# Patient Record
Sex: Female | Born: 1980 | Hispanic: No | Marital: Married | Smoking: Never smoker
Health system: Southern US, Community
[De-identification: ages and names within clinical notes are randomized; demographics above are authoritative.]

## PROBLEM LIST (undated history)

## (undated) DIAGNOSIS — D649 Anemia, unspecified: Secondary | ICD-10-CM

## (undated) DIAGNOSIS — Z789 Other specified health status: Secondary | ICD-10-CM

## (undated) DIAGNOSIS — O139 Gestational [pregnancy-induced] hypertension without significant proteinuria, unspecified trimester: Secondary | ICD-10-CM

## (undated) HISTORY — DX: Other specified health status: Z78.9

## (undated) HISTORY — DX: Gestational (pregnancy-induced) hypertension without significant proteinuria, unspecified trimester: O13.9

## (undated) HISTORY — DX: Anemia, unspecified: D64.9

---

## 2018-04-03 NOTE — L&D Delivery Note (Signed)
Date of delivery: 08/23/2018 Estimated Date of Delivery: 08/20/18 No LMP recorded. EGA: [redacted]w[redacted]d  Delivery Note At 3:13 PM a viable female was delivered via Vaginal, Spontaneous, Presentation: OA; ROA.  APGAR: 9, 9; weight 7 lb 5.8 oz (3340 g).   Placenta status: spontaneous, intact.  Cord:  with the following complications: none.  Cord pH: NA  Patient assessed for readiness to deliver as baby was having some variable decelerations. Mom pushed well to deliver a viable female infant.  The head followed by shoulders, which delivered without difficulty, and the rest of the body.  Body cord noted and reduced after delivery.  Baby to mom's chest.  Cord clamped and cut after 3 min delay.  Cord blood obtained.  Placenta delivered spontaneously, intact with active management, with a 3-vessel cord.  Perineum intact.  All counts correct.  Hemostasis obtained with IV pitocin and fundal massage.    Anesthesia: epidural  Episiotomy: None Lacerations: None Suture Repair: NA Est. Blood Loss (mL): 400  Mom to postpartum.  Baby to Couplet care / Skin to Skin.  Tresea Mall, CNM 08/23/2018, 5:35 PM

## 2018-04-29 LAB — HM PAP SMEAR: HM Pap smear: NEGATIVE

## 2018-04-29 LAB — OB RESULTS CONSOLE RUBELLA ANTIBODY, IGM: Rubella: IMMUNE

## 2018-04-29 LAB — OB RESULTS CONSOLE VARICELLA ZOSTER ANTIBODY, IGG: Varicella: IMMUNE

## 2018-04-29 LAB — SICKLE CELL SCREEN: Sickle Cell Screen: NORMAL

## 2018-05-27 LAB — HM HIV SCREENING LAB: HM HIV Screening: NEGATIVE

## 2018-06-26 ENCOUNTER — Telehealth: Payer: Self-pay | Admitting: Obstetrics and Gynecology

## 2018-06-26 NOTE — Telephone Encounter (Signed)
[facility] referring for [facility] consult. Called and left voicemail for patient to call back to be schedule

## 2018-07-01 ENCOUNTER — Encounter: Payer: Self-pay | Admitting: Obstetrics and Gynecology

## 2018-07-01 ENCOUNTER — Other Ambulatory Visit: Payer: Self-pay

## 2018-07-10 ENCOUNTER — Ambulatory Visit (INDEPENDENT_AMBULATORY_CARE_PROVIDER_SITE_OTHER): Payer: Self-pay | Admitting: Obstetrics and Gynecology

## 2018-07-10 ENCOUNTER — Other Ambulatory Visit: Payer: Self-pay

## 2018-07-10 ENCOUNTER — Encounter: Payer: Self-pay | Admitting: Obstetrics and Gynecology

## 2018-07-10 VITALS — Ht 65.0 in | Wt 202.0 lb

## 2018-07-10 DIAGNOSIS — O34219 Maternal care for unspecified type scar from previous cesarean delivery: Secondary | ICD-10-CM | POA: Insufficient documentation

## 2018-07-10 DIAGNOSIS — Z3A34 34 weeks gestation of pregnancy: Secondary | ICD-10-CM

## 2018-07-10 DIAGNOSIS — O0990 Supervision of high risk pregnancy, unspecified, unspecified trimester: Secondary | ICD-10-CM

## 2018-07-10 DIAGNOSIS — O0993 Supervision of high risk pregnancy, unspecified, third trimester: Secondary | ICD-10-CM

## 2018-07-10 DIAGNOSIS — O99013 Anemia complicating pregnancy, third trimester: Secondary | ICD-10-CM

## 2018-07-10 DIAGNOSIS — O99019 Anemia complicating pregnancy, unspecified trimester: Secondary | ICD-10-CM | POA: Insufficient documentation

## 2018-07-10 DIAGNOSIS — O09523 Supervision of elderly multigravida, third trimester: Secondary | ICD-10-CM

## 2018-07-10 NOTE — Progress Notes (Signed)
Virtual Visit via Telephone Note  I connected with Lindsey Whitney on 07/10/18 at  8:30 AM EDT by telephone and verified that I am speaking with the correct person using two identifiers.   I discussed the limitations, risks, security and privacy concerns of performing an evaluation and management service by telephone and the availability of in person appointments. I also discussed with the patient that there may be a patient responsible charge related to this service. The patient expressed understanding and agreed to proceed.  She was at home and I was in my office.  Chief Complaint: discuss trial of labor after cesarean delivery. She is seen in referral at the request of Dr. Lyndel Safe from the [facility] Department for this issue.  History of Present Illness: 38 y.o. G51P2002 female at [redacted]w[redacted]d who presents in referral from the [facility] health department for delivery planning.  The patient states that with her first pregnancy she underwent a primary cesarean section due to fetal malpresentation.  Her second pregnancy resulted in a successful vaginal birth after cesarean.  She greatly desires to have another attempted TOLAC.  Her pregnancy has been complicated by late entry into care at 24 weeks.  This may be due to her moving to the area later in the pregnancy.  She is also noted to have anemia for which she takes iron supplementation.  She is also considered advanced maternal age.  Today she notes good fetal movement.  She denies vaginal bleeding, leakage of fluid, and contractions.  Past Medical History:  Diagnosis Date  . No known health problems     Past Surgical History:  Procedure Laterality Date  . CESAREAN SECTION      Current Outpatient Medications on File Prior to Visit  Medication Sig Dispense Refill  . ferrous sulfate 325 (65 FE) MG tablet Take 325 mg by mouth daily with breakfast.    . Prenatal Vit-Fe Fumarate-FA (MULTIVITAMIN-PRENATAL) 27-0.8 MG TABS  tablet Take 1 tablet by mouth daily at 12 noon.     No current facility-administered medications on file prior to visit.    Allergies: No Known Allergies   Social History   Tobacco Use  . Smoking status: Never Smoker  . Smokeless tobacco: Never Used  Substance Use Topics  . Alcohol use: Never    Frequency: Never  . Drug use: Never     Family History: patient denies history of gynecologic cancer and denies history of birth defects.  Review of Systems  Constitutional: Negative.   HENT: Negative.   Eyes: Negative.   Respiratory: Negative.   Cardiovascular: Negative.   Gastrointestinal: Negative.   Genitourinary: Negative.   Musculoskeletal: Negative.   Skin: Negative.   Neurological: Negative.   Psychiatric/Behavioral: Negative.       Observations/Objective: No exam today, due to telephone eVisit due to [facility] virus restriction on elective visits and procedures.  Prior visits reviewed along with ultrasounds/labs as indicated.  Assessment and Plan: pregnancy Problems (from 07/10/18 to present)    Problem Noted Resolved   Supervision of high risk pregnancy, antepartum 07/10/2018 by Conard Novak, MD No   High risk pregnancy, multigravida of advanced maternal age in third trimester 07/10/2018 by Conard Novak, MD No   History of cesarean delivery affecting pregnancy 07/10/2018 by Conard Novak, MD No   Anemia in pregnancy 07/10/2018 by Conard Novak, MD No     38 y.o. [id number] at [redacted]w[redacted]d with Estimated Date of Delivery: 08/20/18 was seen today in  office to discuss trial of labor after cesarean section (TOLAC) versus elective repeat cesarean delivery (ERCD). The following risks were discussed with the patient.  Risk of uterine rupture at term is 0.78 percent with TOLAC and 0.22 percent with ERCD. 1 in 10 uterine ruptures will result in neonatal death or neurological injury. The benefits of a trial of labor after cesarean (TOLAC) resulting in a vaginal birth after  cesarean (VBAC) include the following: shorter length of hospital stay and postpartum recovery (in most cases); fewer complications, such as postpartum fever, wound or uterine infection, thromboembolism (blood clots in the leg or lung), need for blood transfusion and fewer neonatal breathing problems. The risks of an attempted VBAC or TOLAC include the following: Risk of failed trial of labor after cesarean (TOLAC) without a vaginal birth after cesarean (VBAC) resulting in repeat cesarean delivery (RCD) in about 20 to 40 percent of women who attempt VBAC.  Her individualized success rate using the MFMU VBAC risk calculator is 75%.   Risk of rupture of uterus resulting in an emergency cesarean delivery. The risk of uterine rupture may be related in part to the type of uterine incision made during the first cesarean delivery. A previous transverse uterine incision has the lowest risk of rupture (0.2 to 1.5 percent risk). Vertical or T-shaped uterine incisions have a higher risk of uterine rupture (4 to 9 percent risk)The risk of fetal death is very low with both VBAC and elective repeat cesarean delivery (ERCD), but the likelihood of fetal death is higher with VBAC than with ERCD. Maternal death is very rare with either type of delivery. The risks of an elective repeat cesarean delivery (ERCD) were reviewed with the patient including but not limited to: 05/998 risk of uterine rupture which could have serious consequences, bleeding which may require transfusion; infection which may require antibiotics; injury to bowel, bladder or other surrounding organs (bowel, bladder, ureters); injury to the fetus; need for additional procedures including hysterectomy in the event of a life-threatening hemorrhage; thromboembolic phenomenon; abnormal placentation; incisional problems; death and other postoperative or anesthesia complications.    In addition we discussed that our collective office practice is to allow patient's  who desire to attempt TOLAC to go into labor naturally.  There is some limited data that rupture rate may increase past [redacted] weeks gestation, but it is reasonable for women who are strongly committed to [facility] to continue pregnancy into the 41st week.  Medical indications necessetating early delivery may arise during the course of any pregnancy.  Given the contraindication on the use of prostaglandins for use in cervical ripening,  recommendation would be to proceed with repeat cesarean for delivery for patient's with unfavorable cervix (low Bishops score) who reach 41 weeks or who otherwise have a medical indication for early delivery.   These risks and benefits are summarized on the consent form, which was reviewed with the patient during the visit.  All her questions answered and she signed a consent indicating a preference for TOLAC/ERCD. A copy of the consent was given to the patient.  She would like to proceed with Trial Of Labor After Cesarean Delivery.  Given the patient's history, I think she would be an excellent candidate for a trial of labor.  She understands that she has the right to select a C-section instead of a vaginal delivery attempt.  All questions answered.  Follow Up Instructions:    I discussed the assessment and treatment plan with the patient. The patient was provided  an opportunity to ask questions and all were answered. The patient agreed with the plan and demonstrated an understanding of the instructions.   The patient was advised to call back or seek an in-person evaluation if the symptoms worsen or if the condition fails to improve as anticipated.  I provided 30 minutes of non-face-to-face time during this encounter.  Thomasene MohairStephen , MD, Merlinda FrederickFACOG [facility], [facility] 07/10/2018 12:05 PM    CC: Department, [facility] [address] [place], [place] [id number]

## 2018-07-24 ENCOUNTER — Other Ambulatory Visit: Payer: Self-pay | Admitting: Advanced Practice Midwife

## 2018-07-24 DIAGNOSIS — Z3483 Encounter for supervision of other normal pregnancy, third trimester: Secondary | ICD-10-CM

## 2018-07-26 ENCOUNTER — Other Ambulatory Visit: Payer: Self-pay

## 2018-07-26 ENCOUNTER — Ambulatory Visit
Admission: RE | Admit: 2018-07-26 | Discharge: 2018-07-26 | Disposition: A | Discharge status: Home / Self Care | Payer: Self-pay | Source: Ambulatory Visit | Attending: Advanced Practice Midwife | Admitting: Advanced Practice Midwife

## 2018-07-26 DIAGNOSIS — Z3483 Encounter for supervision of other normal pregnancy, third trimester: Secondary | ICD-10-CM | POA: Insufficient documentation

## 2018-07-31 ENCOUNTER — Other Ambulatory Visit: Payer: Self-pay

## 2018-07-31 ENCOUNTER — Observation Stay
Admission: RE | Admit: 2018-07-31 | Discharge: 2018-07-31 | Disposition: A | Discharge status: Home / Self Care | Payer: Self-pay | Attending: Obstetrics & Gynecology | Admitting: Obstetrics & Gynecology

## 2018-07-31 DIAGNOSIS — O0990 Supervision of high risk pregnancy, unspecified, unspecified trimester: Secondary | ICD-10-CM

## 2018-07-31 DIAGNOSIS — O99013 Anemia complicating pregnancy, third trimester: Secondary | ICD-10-CM

## 2018-07-31 DIAGNOSIS — Z3A37 37 weeks gestation of pregnancy: Secondary | ICD-10-CM

## 2018-07-31 DIAGNOSIS — O321XX0 Maternal care for breech presentation, not applicable or unspecified: Secondary | ICD-10-CM

## 2018-07-31 DIAGNOSIS — O34219 Maternal care for unspecified type scar from previous cesarean delivery: Secondary | ICD-10-CM

## 2018-07-31 DIAGNOSIS — O09523 Supervision of elderly multigravida, third trimester: Secondary | ICD-10-CM

## 2018-07-31 NOTE — Discharge Summary (Signed)
Physician Final Progress Note  Patient ID: Lindsey Whitney MRN: [id number] DOB/AGE: 1980-04-09 38 y.o.  Admit date: 07/31/2018 Admitting provider: Nadara Mustard, MD Discharge date: 07/31/2018   Admission Diagnoses: breech on previous ultrasound  Discharge Diagnoses:  Active Problems:   Breech presentation   History of successful vaginal birth after cesarean, currently pregnant IUP at 37 weeks Reactive NST  History of Present Illness: The patient is a 38 y.o. female G3P2002 at [redacted]w[redacted]d who presents from the health department for evaluation of fetal presentation. Fetus was breech on ultrasound 5 days ago. Provider at health department today was concerned if baby was still breech she would need to be scheduled for external version soon and was requesting ultrasound evaluation. Dr Tiburcio Pea later informed me that ECV is not recommended with a history of cesarean section even though she had a successful VBAC. The patient admits good fetal movement. She admits occasional braxton hicks contractions. She denies leakage of fluid or vaginal bleeding. She has not felt any big fetal movements since her ultrasound 5 days ago.   Past Medical History:  Diagnosis Date  . No known health problems     Past Surgical History:  Procedure Laterality Date  . CESAREAN SECTION      No current facility-administered medications on file prior to encounter.    Current Outpatient Medications on File Prior to Encounter  Medication Sig Dispense Refill  . ferrous sulfate 325 (65 FE) MG tablet Take 325 mg by mouth daily with breakfast.    . Prenatal Vit-Fe Fumarate-FA (MULTIVITAMIN-PRENATAL) 27-0.8 MG TABS tablet Take 1 tablet by mouth daily at 12 noon.      No Known Allergies  Social History   Socioeconomic History  . Marital status: Married    Spouse name: Not on file  . Number of children: Not on file  . Years of education: Not on file  . Highest education level: Not on file  Occupational History  .  Not on file  Social Needs  . Financial resource strain: Not on file  . Food insecurity:    Worry: Not on file    Inability: Not on file  . Transportation needs:    Medical: Not on file    Non-medical: Not on file  Tobacco Use  . Smoking status: Never Smoker  . Smokeless tobacco: Never Used  Substance and Sexual Activity  . Alcohol use: Never    Frequency: Never  . Drug use: Never  . Sexual activity: Yes    Birth control/protection: None  Lifestyle  . Physical activity:    Days per week: Not on file    Minutes per session: Not on file  . Stress: Not on file  Relationships  . Social connections:    Talks on phone: Not on file    Gets together: Not on file    Attends religious service: Not on file    Active member of club or organization: Not on file    Attends meetings of clubs or organizations: Not on file    Relationship status: Not on file  . Intimate partner violence:    Fear of current or ex partner: Not on file    Emotionally abused: Not on file    Physically abused: Not on file    Forced sexual activity: Not on file  Other Topics Concern  . Not on file  Social History Narrative  . Not on file    History reviewed. No pertinent family history.   Review of  Systems  Constitutional: Negative.   HENT: Negative.   Eyes: Negative.   Respiratory: Negative.   Cardiovascular: Negative.   Gastrointestinal: Negative.   Genitourinary: Negative.   Musculoskeletal: Negative.   Skin: Negative.   Neurological: Negative.   Endo/Heme/Allergies: Negative.   Psychiatric/Behavioral: Negative.      Physical Exam: BP 131/69 (BP Location: Left Arm)   Pulse 93   Temp 98.8 F (37.1 C) (Oral)   Resp 18   Ht 5\' 5"  (1.651 m)   Wt 91.6 kg   SpO2 100%   BMI 33.61 kg/m   Constitutional: Well nourished, well developed female in no acute distress.  HEENT: normal Skin: Warm and dry.  Cardiovascular: Regular rate and rhythm.   Extremity: no edema  Respiratory: Clear to  auscultation bilateral. Normal respiratory effort Abdomen: FHT present Back: no CVAT Neuro: DTRs 2+, Cranial nerves grossly intact Psych: Alert and Oriented x3. No memory deficits. Normal mood and affect.  MS: normal gait, normal bilateral lower extremity ROM/strength/stability.  Pelvic exam: deferred Toco: occasional contraction, mild to palpation Fetal well being: 130 bpm baseline, moderate variability, +accelerations, variable noted  Consults: None  Significant Findings/ Diagnostic Studies: bedside ultrasound Breech with vertex maternal upper left quadrant  Procedures: NST  Hospital Course: The patient was admitted to Labor and Delivery Triage for observation.   Discharge Condition: good  Disposition: Discharge disposition: 01-Home or Self Care       Diet: Regular diet  Discharge Activity: Activity as tolerated  Discharge Instructions    Discharge activity:  No Restrictions   Complete by:  As directed    Discharge diet:  No restrictions   Complete by:  As directed    Fetal Kick Count:  Lie on our left side for one hour after a meal, and count the number of times your baby kicks.  If it is less than 5 times, get up, move around and drink some juice.  Repeat the test 30 minutes later.  If it is still less than 5 kicks in an hour, notify your doctor.   Complete by:  As directed    LABOR:  When conractions begin, you should start to time them from the beginning of one contraction to the beginning  of the next.  When contractions are 5 - 10 minutes apart or less and have been regular for at least an hour, you should call your health care provider.   Complete by:  As directed    No sexual activity restrictions   Complete by:  As directed    Notify physician for bleeding from the vagina   Complete by:  As directed    Notify physician for blurring of vision or spots before the eyes   Complete by:  As directed    Notify physician for chills or fever   Complete by:  As  directed    Notify physician for fainting spells, "black outs" or loss of consciousness   Complete by:  As directed    Notify physician for increase in vaginal discharge   Complete by:  As directed    Notify physician for leaking of fluid   Complete by:  As directed    Notify physician for pain or burning when urinating   Complete by:  As directed    Notify physician for pelvic pressure (sudden increase)   Complete by:  As directed    Notify physician for severe or continued nausea or vomiting   Complete by:  As directed  Notify physician for sudden gushing of fluid from the vagina (with or without continued leaking)   Complete by:  As directed    Notify physician for sudden, constant, or occasional abdominal pain   Complete by:  As directed    Notify physician if baby moving less than usual   Complete by:  As directed      Allergies as of 07/31/2018   No Known Allergies     Medication List    TAKE these medications   ferrous sulfate 325 (65 FE) MG tablet Take 325 mg by mouth daily with breakfast.   multivitamin-prenatal 27-0.8 MG Tabs tablet Take 1 tablet by mouth daily at 12 noon.      Recommended patient look up spinning babies positions for optimal fetal positioning  Follow-up Information    [facility]. Schedule an appointment as soon as possible for a visit in 1 week(s).   Specialty:  Obstetrics and Gynecology Why:  prenatal care visit with Dr Lalla Brothers information: [address] [place] [address] [phone]          Total time spent taking care of this patient: 15 minutes  Signed: Tresea Mall, CNM  07/31/2018, 2:53 PM

## 2018-07-31 NOTE — Discharge Summary (Signed)
RN reviewed discharge instructions with patient. Gave patient opportunity for questions. All questions answered at this time. Pt verbalized understanding. Pt discharged home. 

## 2018-07-31 NOTE — OB Triage Note (Signed)
Fetal monitors applied. Pt denies pain, bleeding , or LOF. Reports positive fetal movement. Vitals WNL. Will continue to monitor.

## 2018-08-01 ENCOUNTER — Telehealth: Payer: Self-pay | Admitting: Obstetrics & Gynecology

## 2018-08-01 NOTE — Telephone Encounter (Signed)
-----   Message from Nadara Mustard, MD sent at 08/01/2018  1:54 PM EDT ----- Regarding: Surgery Surgery Booking Request Patient Full Name:  Lindsey Whitney  MRN: [id number]  DOB: [birth year]  Surgeon: Letitia Libra, MD  Requested Surgery Date and Time: 08/15/18 Primary Diagnosis AND Code: Breech, Term pregnancy, Prior cesarean Secondary Diagnosis and Code:  Surgical Procedure: Cesarean Section L&D Notification: Yes Admission Status: surgery admit Length of Surgery: 1 hr Special Case Needs: no H&P: yes May 8 (date) Phone Interview???: yes Interpreter: Language:  Medical Clearance: no Special Scheduling Instructions: no

## 2018-08-01 NOTE — Telephone Encounter (Signed)
Patient is aware of H&P at [facility] on Friday, 08/09/18 @ 2:10pm w/ Dr. Tiburcio Pea, Pre-admit Testing to be scheduled for the following week, and OR on 08/15/18. Directions were given. Patient is aware of check-in procedure.

## 2018-08-04 ENCOUNTER — Encounter: Payer: Self-pay | Admitting: Anesthesiology

## 2018-08-04 ENCOUNTER — Encounter: Payer: Self-pay | Admitting: *Deleted

## 2018-08-04 ENCOUNTER — Observation Stay
Admission: RE | Admit: 2018-08-04 | Discharge: 2018-08-04 | Disposition: A | Discharge status: Home / Self Care | Payer: Self-pay | Attending: Obstetrics and Gynecology | Admitting: Obstetrics and Gynecology

## 2018-08-04 DIAGNOSIS — O0990 Supervision of high risk pregnancy, unspecified, unspecified trimester: Secondary | ICD-10-CM

## 2018-08-04 DIAGNOSIS — O34219 Maternal care for unspecified type scar from previous cesarean delivery: Secondary | ICD-10-CM

## 2018-08-04 DIAGNOSIS — O321XX0 Maternal care for breech presentation, not applicable or unspecified: Principal | ICD-10-CM | POA: Diagnosis present

## 2018-08-04 DIAGNOSIS — O99013 Anemia complicating pregnancy, third trimester: Secondary | ICD-10-CM | POA: Insufficient documentation

## 2018-08-04 DIAGNOSIS — O09523 Supervision of elderly multigravida, third trimester: Secondary | ICD-10-CM | POA: Insufficient documentation

## 2018-08-04 DIAGNOSIS — Z3A37 37 weeks gestation of pregnancy: Secondary | ICD-10-CM | POA: Insufficient documentation

## 2018-08-04 HISTORY — DX: Other specified health status: Z78.9

## 2018-08-04 LAB — CBC
HCT: 39 % (ref 36.0–46.0)
Hemoglobin: 12.8 g/dL (ref 12.0–15.0)
MCH: 30.4 pg (ref 26.0–34.0)
MCHC: 32.8 g/dL (ref 30.0–36.0)
MCV: 92.6 fL (ref 80.0–100.0)
Platelets: 229 10*3/uL (ref 150–400)
RBC: 4.21 MIL/uL (ref 3.87–5.11)
RDW: 15.2 % (ref 11.5–15.5)
WBC: 10 10*3/uL (ref 4.0–10.5)
nRBC: 0 % (ref 0.0–0.2)

## 2018-08-04 LAB — TYPE AND SCREEN
ABO/RH(D): O POS
Antibody Screen: NEGATIVE

## 2018-08-04 MED ORDER — MINERAL OIL LIGHT 100 % EX OIL
TOPICAL_OIL | Freq: Once | CUTANEOUS | Status: DC
  Filled 2018-08-04: qty 25

## 2018-08-04 MED ORDER — FENTANYL CITRATE (PF) 100 MCG/2ML IJ SOLN
50.0000 ug | INTRAMUSCULAR | Status: DC
  Administered 2018-08-04: 20:00:00 50 ug via INTRAVENOUS
  Filled 2018-08-04: qty 2

## 2018-08-04 MED ORDER — LACTATED RINGERS IV SOLN
INTRAVENOUS | Status: DC
  Administered 2018-08-04: 19:00:00 via INTRAVENOUS

## 2018-08-04 MED ORDER — TERBUTALINE SULFATE 1 MG/ML IJ SOLN
0.2500 mg | INTRAMUSCULAR | Status: DC
  Administered 2018-08-04: 0.25 mg via SUBCUTANEOUS
  Filled 2018-08-04: qty 1

## 2018-08-04 MED ORDER — LIDOCAINE HCL (PF) 1 % IJ SOLN: 30.0000 mL | INTRAMUSCULAR | Status: DC | PRN

## 2018-08-04 NOTE — H&P (Signed)
OB History & Physical   History of Present Illness:  Chief Complaint: Malpresentation of fetus  HPI:  Lindsey Whitney is a 38 y.o. G50P2002 female at [redacted]w[redacted]d dated by an 18 week ultrasound (see Media tab under AMB referral on 07/18/2018).  Her pregnancy has been complicated by history of cesarean delivery with subsequent successfull VBAC, late entry into care in the [place] at 24 weeks (just moved here), anemia, AMA. .    She reports mild contractions.   She denies leakage of fluid.   She denies vaginal bleeding.   She reports fetal movement.    Maternal Medical History:   Past Medical History:  Diagnosis Date  . Medical history non-contributory   . No known health problems     Past Surgical History:  Procedure Laterality Date  . CESAREAN SECTION      No Known Allergies  Prior to Admission medications   Medication Sig Start Date End Date Taking? Authorizing Provider  ferrous sulfate 325 (65 FE) MG tablet Take 325 mg by mouth daily with breakfast.   Yes [provider]  Prenatal Vit-Fe Fumarate-FA (MULTIVITAMIN-PRENATAL) 27-0.8 MG TABS tablet Take 1 tablet by mouth daily at 12 noon.   Yes [provider]    OB History  Gravida Para Term Preterm AB Living  3 2 2     2   SAB TAB Ectopic Multiple Live Births          2    # Outcome Date GA Lbr Len/2nd Weight Sex Delivery Anes PTL Lv  3 Current           2 Term 09/20/11    F VBAC   LIV  1 Term 04/22/07    F CS-LTranv   LIV    Prenatal care site: [facility]  Social History: She  reports that she has never smoked. She has never used smokeless tobacco. She reports that she does not drink alcohol or use drugs.  Family History: Patient denies history of gynecologic cancer and denies a history of birth defects.   Review of Systems:  Review of Systems  Constitutional: Negative.   HENT: Negative.   Eyes: Negative.   Respiratory: Negative.   Cardiovascular: Negative.   Gastrointestinal: Negative.   Genitourinary:  Negative.   Musculoskeletal: Negative.   Skin: Negative.   Neurological: Negative.   Psychiatric/Behavioral: Negative.      Physical Exam:  Vital Signs: BP 123/81 (BP Location: Right Arm)   Pulse 90   Temp 98.5 F (36.9 C) (Oral)  Physical Exam Constitutional:      General: She is not in acute distress.    Appearance: Normal appearance.  HENT:     Head: Normocephalic and atraumatic.  Eyes:     General: No scleral icterus.    Conjunctiva/sclera: Conjunctivae normal.  Abdominal:     Comments: Gravid, NT  Neurological:     General: No focal deficit present.     Mental Status: She is alert and oriented to person, place, and time.     Cranial Nerves: No cranial nerve deficit.  Psychiatric:        Mood and Affect: Mood normal.        Behavior: Behavior normal.        Judgment: Judgment normal.       Pertinent Results:   Baseline FHR: 130 beats/min   Variability: moderate   Accelerations: present   Decelerations: absent Contractions: present frequency: infrequent Overall assessment: cat 1  Bedside Ultrasound:  Number  of Fetus: 1  Presentation: (prior to procedure) breech/transverse. Head maternal left upper quadrant. Breech just to right of cervical os  Fluid: subjectively normal.  Placental Location: anterior/left  Assessment:  Lindsey Whitney is a 38 y.o. 503P2002 female at 5761w5d with malpresentation of fetus for external cephalic version.   Plan:  1. Admit to Labor & Delivery for procedure 2. CBC, T&S, NPO, IVF 3. GBS positive.   4. Fetwal well-being: reassuring 5. Malpresentation of fetus: discussed the risks and benefits of procedure. The risks include; failure of procedure, success of procedure with subsequent return to a non-cephalic presentation, discomfort, provocation of labor, rupture of membranes, placental abruption, fetal intolerance of procedure.  Benefits include: ability to attempt a trial of labor after cesarean. She has previously been counseled  regarding TOLAC. See previous notefrom 07/10/2018.  She agrees to proceed. Consents signed.    Thomasene MohairStephen Knut Rondinelli, MD 08/04/2018 8:43 PM

## 2018-08-04 NOTE — OB Triage Note (Signed)
Patient to LDR 2 for elective external cephalic version.

## 2018-08-04 NOTE — Discharge Summary (Signed)
Physician Final Progress Note  Patient ID: Lindsey Whitney MRN: [id number] DOB/AGE: 1980-06-16 38 y.o.  Admit date: 08/04/2018 Admitting provider: Conard NovakStephen D Jackson, MD Discharge date: 08/05/2018   Admission Diagnoses: presents for external cephalic version  Discharge Diagnoses:  Active Problems:   Supervision of high risk pregnancy, antepartum   Breech presentation IUP at 37 weeks Reactive NST Successful external version  Prenatal Care at [facility]  History of Present Illness: The patient is a 38 y.o. female G3P2002 at 3424w5d who presents for external cephalic version with Dr Jean RosenthalJackson. The patient has a history of a cesarean section followed by a successful VBAC in [place] in 2009 and 2013. Her current pregnancy is complicated by anemia and breech presentation. Following successful version this evening- see procedure note- she was monitored for 2 hours. Fetal heart tones were reassuring throughout.  Contraction frequency was every 3-10 minutes. The patient did not feel that she was in labor. She admits good fetal movement. She denies leakage of fluid or vaginal bleeding. Cervical exam was reassuring that patient was not in labor. She was discharged home with follow up instructions and precautions.   Past Medical History:  Diagnosis Date  . Medical history non-contributory   . No known health problems     Past Surgical History:  Procedure Laterality Date  . CESAREAN SECTION      No current facility-administered medications on file prior to encounter.    Current Outpatient Medications on File Prior to Encounter  Medication Sig Dispense Refill  . ferrous sulfate 325 (65 FE) MG tablet Take 325 mg by mouth daily with breakfast.    . Prenatal Vit-Fe Fumarate-FA (MULTIVITAMIN-PRENATAL) 27-0.8 MG TABS tablet Take 1 tablet by mouth daily at 12 noon.      No Known Allergies  Social History   Socioeconomic History  . Marital status: Married    Spouse name: Not on file  . Number of  children: Not on file  . Years of education: Not on file  . Highest education level: Not on file  Occupational History  . Not on file  Social Needs  . Financial resource strain: Not on file  . Food insecurity:    Worry: Not on file    Inability: Not on file  . Transportation needs:    Medical: Not on file    Non-medical: Not on file  Tobacco Use  . Smoking status: Never Smoker  . Smokeless tobacco: Never Used  Substance and Sexual Activity  . Alcohol use: Never    Frequency: Never  . Drug use: Never  . Sexual activity: Yes    Birth control/protection: None  Lifestyle  . Physical activity:    Days per week: Not on file    Minutes per session: Not on file  . Stress: Not on file  Relationships  . Social connections:    Talks on phone: Not on file    Gets together: Not on file    Attends religious service: Not on file    Active member of club or organization: Not on file    Attends meetings of clubs or organizations: Not on file    Relationship status: Not on file  . Intimate partner violence:    Fear of current or ex partner: Not on file    Emotionally abused: Not on file    Physically abused: Not on file    Forced sexual activity: Not on file  Other Topics Concern  . Not on file  Social History Narrative  .  Not on file    History reviewed. No pertinent family history.   Review of Systems  Constitutional: Negative.   HENT: Negative.   Eyes: Negative.   Respiratory: Negative.   Cardiovascular: Negative.   Gastrointestinal:       Uterine contractions  Genitourinary: Negative.   Musculoskeletal: Negative.   Skin: Negative.   Neurological: Negative.   Endo/Heme/Allergies: Negative.   Psychiatric/Behavioral: Negative.      Physical Exam: BP 123/81 (BP Location: Right Arm)   Pulse 90   Temp 98.5 F (36.9 C) (Oral)   Constitutional: Well nourished, well developed female in no acute distress.  HEENT: normal Skin: Warm and dry.  Cardiovascular: Regular  rate and rhythm.   Extremity: no edema  Respiratory: Clear to auscultation bilateral. Normal respiratory effort Abdomen: FHT present Back: no CVAT Neuro: DTRs 2+, Cranial nerves grossly intact Psych: Alert and Oriented x3. No memory deficits. Normal mood and affect.  MS: normal gait, normal bilateral lower extremity ROM/strength/stability.  Pelvic exam: (female chaperone present) is not limited by body habitus EGBUS: within normal limits Vagina: within normal limits and with normal mucosa Cervix: posterior FT/thick/ballotable   Consults: None  Significant Findings/ Diagnostic Studies: labs:   Results for Lindsey Whitney, Lindsey Whitney (MRN [id number]) as of 08/05/2018 00:47  Ref. Range 08/04/2018 18:53 08/04/2018 19:17  WBC Latest Ref Range: 4.0 - 10.5 K/uL 10.0   RBC Latest Ref Range: 3.87 - 5.11 MIL/uL 4.21   Hemoglobin Latest Ref Range: 12.0 - 15.0 g/dL 88.5   HCT Latest Ref Range: 36.0 - 46.0 % 39.0   MCV Latest Ref Range: 80.0 - 100.0 fL 92.6   MCH Latest Ref Range: 26.0 - 34.0 pg 30.4   MCHC Latest Ref Range: 30.0 - 36.0 g/dL 02.7   RDW Latest Ref Range: 11.5 - 15.5 % 15.2   Platelets Latest Ref Range: 150 - 400 K/uL 229   nRBC Latest Ref Range: 0.0 - 0.2 % 0.0   Sample Expiration Unknown 08/07/2018... 08/07/2018...  Antibody Screen Unknown PENDING NEG  ABO/RH(D) Unknown PENDING O POS    Procedures: NST/external cephalic version  Hospital Course: The patient was admitted to Labor and Delivery for procedure.   Discharge Condition: good  Disposition: Discharge disposition: 01-Home or Self Care       Diet: Regular diet  Discharge Activity: Activity as tolerated  Discharge Instructions    Discharge activity:  No Restrictions   Complete by:  As directed    Discharge diet:  No restrictions   Complete by:  As directed    Fetal Kick Count:  Lie on our left side for one hour after a meal, and count the number of times your baby kicks.  If it is less than 5 times, get up, move  around and drink some juice.  Repeat the test 30 minutes later.  If it is still less than 5 kicks in an hour, notify your doctor.   Complete by:  As directed    LABOR:  When conractions begin, you should start to time them from the beginning of one contraction to the beginning  of the next.  When contractions are 5 - 10 minutes apart or less and have been regular for at least an hour, you should call your health care provider.   Complete by:  As directed    No sexual activity restrictions   Complete by:  As directed    Notify physician for bleeding from the vagina   Complete by:  As directed  Notify physician for blurring of vision or spots before the eyes   Complete by:  As directed    Notify physician for chills or fever   Complete by:  As directed    Notify physician for fainting spells, "black outs" or loss of consciousness   Complete by:  As directed    Notify physician for increase in vaginal discharge   Complete by:  As directed    Notify physician for leaking of fluid   Complete by:  As directed    Notify physician for pain or burning when urinating   Complete by:  As directed    Notify physician for pelvic pressure (sudden increase)   Complete by:  As directed    Notify physician for severe or continued nausea or vomiting   Complete by:  As directed    Notify physician for sudden gushing of fluid from the vagina (with or without continued leaking)   Complete by:  As directed    Notify physician for sudden, constant, or occasional abdominal pain   Complete by:  As directed    Notify physician if baby moving less than usual   Complete by:  As directed      Allergies as of 08/04/2018   No Known Allergies     Medication List    TAKE these medications   ferrous sulfate 325 (65 FE) MG tablet Take 325 mg by mouth daily with breakfast.   multivitamin-prenatal 27-0.8 MG Tabs tablet Take 1 tablet by mouth daily at 12 noon.      Follow-up Information    Department,  [facility] Go to.   Why:  regular scheduled prenatal appointment Contact information: [address] [place] [address] [phone]           Total time spent taking care of this patient: 45 minutes  Signed: Tresea Mall, CNM  08/05/2018, 12:31 AM

## 2018-08-04 NOTE — Procedures (Signed)
The risks, benefits, and alternatives of the procedure were reviewed with the patient.  Greater than 1 hour of fetal monitoring with category 1 tracing obtained prior to procedure.  The patient already had an IV. She was given fentanyl 50 mcg IV x 1. She was given terbutaline [place] x 1.  Using intermittent ultrasound for fetal positioning and heart rate monitoring, the fetal breech was attempted to be elevated from the pelvis.  Elevation of the fetal breech was obtained with an attempted forward roll of the fetus x 1.  The fetus was successfully moved to cephalic presentation with spine on maternal left.  The procedure were terminated at this point.  The patient tolerated the procedure well.  No evidence of fetal distress, no vaginal bleeding, no rupture of membranes.    Patient and fetus will be monitored for about 1-2 hours to ensure no labor and reassuring fetal status.  Fetal heart rate monitored intermittently by ultrasound and documented in EPIC at the time of monitoring.  Thomasene Mohair, MD 08/04/2018 8:58 PM

## 2018-08-09 ENCOUNTER — Encounter: Payer: Self-pay | Admitting: Obstetrics & Gynecology

## 2018-08-09 ENCOUNTER — Other Ambulatory Visit: Payer: Self-pay

## 2018-08-09 ENCOUNTER — Ambulatory Visit (INDEPENDENT_AMBULATORY_CARE_PROVIDER_SITE_OTHER): Payer: Self-pay | Admitting: Obstetrics & Gynecology

## 2018-08-09 VITALS — BP 140/82 | HR 107 | Ht 65.0 in | Wt 215.0 lb

## 2018-08-09 DIAGNOSIS — O321XX0 Maternal care for breech presentation, not applicable or unspecified: Secondary | ICD-10-CM

## 2018-08-09 DIAGNOSIS — Z3A38 38 weeks gestation of pregnancy: Secondary | ICD-10-CM

## 2018-08-09 NOTE — Progress Notes (Signed)
  HPI: Pt presents at 38 3/[redacted] weeks EGA for discussion of breech presentation and delivery planning.  Was seen on Sunday and had ECV at hospital.  Was seen Tuesday at [facility] and was noted to be Vtx.  First pregnancy was breech resulting in CS.  Second pregnancy was a VBAC.  No pain or bleeding or ROM.  Good FM.  PMHx: She  has a past medical history of Medical history non-contributory and No known health problems. Also,  has a past surgical history that includes Cesarean section., family history is not on file.,  reports that she has never smoked. She has never used smokeless tobacco. She reports that she does not drink alcohol or use drugs.  She has a current medication list which includes the following prescription(s): ferrous sulfate and multivitamin-prenatal. Also, has No Known Allergies.  Review of Systems  All other systems reviewed and are negative.  Objective: BP 140/82   Pulse (!) 107   Ht 5\' 5"  (1.651 m)   Wt 215 lb (97.5 kg)   BMI 35.78 kg/m   Physical examination Constitutional NAD, Conversant  Skin No rashes, lesions or ulceration.   Extremities: Moves all appropriately.  Normal ROM for age. No lymphadenopathy.  Neuro: Grossly intact  Psych: Oriented to PPT.  Normal mood. Normal affect.       Abd: FHT 140s.  Vtx by leopolds.  Assessment:  Breech presentation, single or unspecified fetus     Now Vertex following recent ECV    Cont exp mgt, prefers VBAC if labors.  Annamarie Major, MD, [facility], [facility] 08/09/2018  2:46 PM

## 2018-08-12 ENCOUNTER — Inpatient Hospital Stay: Admission: RE | Admit: 2018-08-12 | Payer: Self-pay | Source: Ambulatory Visit

## 2018-08-14 ENCOUNTER — Inpatient Hospital Stay: Admission: RE | Admit: 2018-08-14 | Payer: Self-pay | Source: Ambulatory Visit

## 2018-08-15 ENCOUNTER — Encounter: Admission: RE | Payer: Self-pay | Source: Home / Self Care

## 2018-08-15 ENCOUNTER — Inpatient Hospital Stay: Admission: RE | Admit: 2018-08-15 | Payer: Self-pay | Source: Home / Self Care | Admitting: Obstetrics & Gynecology

## 2018-08-15 SURGERY — Surgical Case
Anesthesia: Choice

## 2018-08-22 ENCOUNTER — Other Ambulatory Visit: Payer: Self-pay | Admitting: Obstetrics & Gynecology

## 2018-08-23 ENCOUNTER — Inpatient Hospital Stay: Payer: Medicaid Other | Admitting: Anesthesiology

## 2018-08-23 ENCOUNTER — Inpatient Hospital Stay
Admission: EM | Admit: 2018-08-23 | Discharge: 2018-08-25 | DRG: 807 | Disposition: A | Discharge status: Home / Self Care | Payer: Medicaid Other | Attending: Advanced Practice Midwife | Admitting: Advanced Practice Midwife

## 2018-08-23 ENCOUNTER — Other Ambulatory Visit: Payer: Self-pay

## 2018-08-23 ENCOUNTER — Encounter: Payer: Self-pay | Admitting: *Deleted

## 2018-08-23 DIAGNOSIS — Z3A40 40 weeks gestation of pregnancy: Secondary | ICD-10-CM

## 2018-08-23 DIAGNOSIS — O9902 Anemia complicating childbirth: Secondary | ICD-10-CM

## 2018-08-23 DIAGNOSIS — O34219 Maternal care for unspecified type scar from previous cesarean delivery: Secondary | ICD-10-CM | POA: Diagnosis present

## 2018-08-23 DIAGNOSIS — O0990 Supervision of high risk pregnancy, unspecified, unspecified trimester: Secondary | ICD-10-CM

## 2018-08-23 DIAGNOSIS — Z1159 Encounter for screening for other viral diseases: Secondary | ICD-10-CM | POA: Diagnosis not present

## 2018-08-23 DIAGNOSIS — O26893 Other specified pregnancy related conditions, third trimester: Secondary | ICD-10-CM | POA: Diagnosis present

## 2018-08-23 DIAGNOSIS — O34211 Maternal care for low transverse scar from previous cesarean delivery: Secondary | ICD-10-CM

## 2018-08-23 DIAGNOSIS — O09523 Supervision of elderly multigravida, third trimester: Secondary | ICD-10-CM

## 2018-08-23 DIAGNOSIS — O99013 Anemia complicating pregnancy, third trimester: Secondary | ICD-10-CM

## 2018-08-23 DIAGNOSIS — O99824 Streptococcus B carrier state complicating childbirth: Secondary | ICD-10-CM | POA: Diagnosis present

## 2018-08-23 DIAGNOSIS — O480 Post-term pregnancy: Secondary | ICD-10-CM | POA: Diagnosis present

## 2018-08-23 LAB — CBC
HCT: 36.8 % (ref 36.0–46.0)
Hemoglobin: 12.4 g/dL (ref 12.0–15.0)
MCH: 30.5 pg (ref 26.0–34.0)
MCHC: 33.7 g/dL (ref 30.0–36.0)
MCV: 90.6 fL (ref 80.0–100.0)
Platelets: 211 10*3/uL (ref 150–400)
RBC: 4.06 MIL/uL (ref 3.87–5.11)
RDW: 13.9 % (ref 11.5–15.5)
WBC: 10.7 10*3/uL — ABNORMAL HIGH (ref 4.0–10.5)
nRBC: 0 % (ref 0.0–0.2)

## 2018-08-23 LAB — TYPE AND SCREEN
ABO/RH(D): O POS
Antibody Screen: NEGATIVE

## 2018-08-23 LAB — SARS CORONAVIRUS 2 BY RT PCR (HOSPITAL ORDER, PERFORMED IN ~~LOC~~ HOSPITAL LAB): SARS Coronavirus 2: NEGATIVE

## 2018-08-23 MED ORDER — SODIUM CHLORIDE 0.9 % IV SOLN
5.0000 10*6.[IU] | Freq: Once | INTRAVENOUS | Status: AC
  Administered 2018-08-23: 03:00:00 5 10*6.[IU] via INTRAVENOUS
  Filled 2018-08-23: qty 5

## 2018-08-23 MED ORDER — COCONUT OIL OIL
1.0000 "application " | TOPICAL_OIL | Status: DC | PRN
  Administered 2018-08-24: 1 via TOPICAL
  Filled 2018-08-23: qty 120

## 2018-08-23 MED ORDER — ACETAMINOPHEN 325 MG PO TABS: 650.0000 mg | ORAL_TABLET | ORAL | Status: DC | PRN

## 2018-08-23 MED ORDER — BUTORPHANOL TARTRATE 1 MG/ML IJ SOLN: 1.0000 mg | INTRAMUSCULAR | Status: DC | PRN

## 2018-08-23 MED ORDER — SIMETHICONE 80 MG PO CHEW: 80.0000 mg | CHEWABLE_TABLET | ORAL | Status: DC | PRN

## 2018-08-23 MED ORDER — OXYTOCIN 40 UNITS IN NORMAL SALINE INFUSION - SIMPLE MED
1.0000 m[IU]/min | INTRAVENOUS | Status: DC
  Administered 2018-08-23: 2 m[IU]/min via INTRAVENOUS

## 2018-08-23 MED ORDER — LIDOCAINE-EPINEPHRINE (PF) 1.5 %-1:200000 IJ SOLN
INTRAMUSCULAR | Status: DC | PRN
  Administered 2018-08-23: 3 mL

## 2018-08-23 MED ORDER — DIPHENHYDRAMINE HCL 25 MG PO CAPS: 25.0000 mg | ORAL_CAPSULE | Freq: Four times a day (QID) | ORAL | Status: DC | PRN

## 2018-08-23 MED ORDER — FENTANYL 2.5 MCG/ML W/ROPIVACAINE 0.15% IN NS 100 ML EPIDURAL (ARMC)
12.0000 mL/h | EPIDURAL | Status: DC | PRN
  Administered 2018-08-23: 12 mL/h via EPIDURAL

## 2018-08-23 MED ORDER — ONDANSETRON HCL 4 MG PO TABS: 4.0000 mg | ORAL_TABLET | ORAL | Status: DC | PRN

## 2018-08-23 MED ORDER — EPHEDRINE 5 MG/ML INJ: 10.0000 mg | INTRAVENOUS | Status: DC | PRN

## 2018-08-23 MED ORDER — LIDOCAINE HCL (PF) 1 % IJ SOLN: 30.0000 mL | INTRAMUSCULAR | Status: DC | PRN

## 2018-08-23 MED ORDER — FENTANYL 2.5 MCG/ML W/ROPIVACAINE 0.15% IN NS 100 ML EPIDURAL (ARMC)
EPIDURAL | Status: AC
  Filled 2018-08-23: qty 100

## 2018-08-23 MED ORDER — ONDANSETRON HCL 4 MG/2ML IJ SOLN: 4.0000 mg | Freq: Four times a day (QID) | INTRAMUSCULAR | Status: DC | PRN

## 2018-08-23 MED ORDER — BENZOCAINE-MENTHOL 20-0.5 % EX AERO: 1.0000 "application " | INHALATION_SPRAY | CUTANEOUS | Status: DC | PRN

## 2018-08-23 MED ORDER — ACETAMINOPHEN 325 MG PO TABS
650.0000 mg | ORAL_TABLET | ORAL | Status: DC | PRN
  Administered 2018-08-25: 650 mg via ORAL
  Filled 2018-08-23 (×2): qty 2

## 2018-08-23 MED ORDER — OXYTOCIN 40 UNITS IN NORMAL SALINE INFUSION - SIMPLE MED: 1.0000 m[IU]/min | INTRAVENOUS | Status: DC

## 2018-08-23 MED ORDER — SENNOSIDES-DOCUSATE SODIUM 8.6-50 MG PO TABS
2.0000 | ORAL_TABLET | ORAL | Status: DC
  Administered 2018-08-24 (×2): 2 via ORAL
  Filled 2018-08-23 (×2): qty 2

## 2018-08-23 MED ORDER — OXYTOCIN BOLUS FROM INFUSION: 500.0000 mL | Freq: Once | INTRAVENOUS | Status: DC

## 2018-08-23 MED ORDER — PENICILLIN G 3 MILLION UNITS IVPB - SIMPLE MED: 3.0000 10*6.[IU] | INTRAVENOUS | Status: DC

## 2018-08-23 MED ORDER — SODIUM CHLORIDE 0.9 % IV SOLN
INTRAVENOUS | Status: DC | PRN
  Administered 2018-08-23 (×3): 5 mL via EPIDURAL

## 2018-08-23 MED ORDER — SOD CITRATE-CITRIC ACID 500-334 MG/5ML PO SOLN
30.0000 mL | ORAL | Status: DC | PRN
  Filled 2018-08-23: qty 30

## 2018-08-23 MED ORDER — AMMONIA AROMATIC IN INHA
RESPIRATORY_TRACT | Status: AC
  Filled 2018-08-23: qty 10

## 2018-08-23 MED ORDER — OXYTOCIN 40 UNITS IN NORMAL SALINE INFUSION - SIMPLE MED: 2.5000 [IU]/h | INTRAVENOUS | Status: DC

## 2018-08-23 MED ORDER — WITCH HAZEL-GLYCERIN EX PADS: 1.0000 "application " | MEDICATED_PAD | CUTANEOUS | Status: DC | PRN

## 2018-08-23 MED ORDER — PHENYLEPHRINE 40 MCG/ML (10ML) SYRINGE FOR IV PUSH (FOR BLOOD PRESSURE SUPPORT)
80.0000 ug | PREFILLED_SYRINGE | INTRAVENOUS | Status: DC | PRN
  Filled 2018-08-23: qty 10

## 2018-08-23 MED ORDER — OXYTOCIN 10 UNIT/ML IJ SOLN
INTRAMUSCULAR | Status: AC
  Filled 2018-08-23: qty 2

## 2018-08-23 MED ORDER — LACTATED RINGERS IV SOLN: 500.0000 mL | INTRAVENOUS | Status: DC | PRN

## 2018-08-23 MED ORDER — TERBUTALINE SULFATE 1 MG/ML IJ SOLN: 0.2500 mg | Freq: Once | INTRAMUSCULAR | Status: DC | PRN

## 2018-08-23 MED ORDER — EPHEDRINE 5 MG/ML INJ
10.0000 mg | INTRAVENOUS | Status: DC | PRN
  Filled 2018-08-23: qty 2

## 2018-08-23 MED ORDER — LACTATED RINGERS IV SOLN
INTRAVENOUS | Status: DC
  Administered 2018-08-23 (×2): via INTRAVENOUS

## 2018-08-23 MED ORDER — MISOPROSTOL 200 MCG PO TABS
ORAL_TABLET | ORAL | Status: AC
  Filled 2018-08-23: qty 4

## 2018-08-23 MED ORDER — TETANUS-DIPHTH-ACELL PERTUSSIS 5-2.5-18.5 LF-MCG/0.5 IM SUSP: 0.5000 mL | Freq: Once | INTRAMUSCULAR | Status: DC

## 2018-08-23 MED ORDER — LACTATED RINGERS IV SOLN: INTRAVENOUS | Status: DC

## 2018-08-23 MED ORDER — OXYTOCIN 10 UNIT/ML IJ SOLN
10.0000 [IU] | Freq: Once | INTRAMUSCULAR | Status: DC
  Filled 2018-08-23: qty 1

## 2018-08-23 MED ORDER — PRENATAL MULTIVITAMIN CH
1.0000 | ORAL_TABLET | Freq: Every day | ORAL | Status: DC
  Administered 2018-08-25: 1 via ORAL
  Filled 2018-08-23: qty 1

## 2018-08-23 MED ORDER — TERBUTALINE SULFATE 1 MG/ML IJ SOLN
0.2500 mg | Freq: Once | INTRAMUSCULAR | Status: DC | PRN
  Filled 2018-08-23: qty 1

## 2018-08-23 MED ORDER — LACTATED RINGERS IV SOLN
500.0000 mL | Freq: Once | INTRAVENOUS | Status: AC
  Administered 2018-08-23: 10:00:00 500 mL via INTRAVENOUS

## 2018-08-23 MED ORDER — OXYTOCIN 40 UNITS IN NORMAL SALINE INFUSION - SIMPLE MED
2.5000 [IU]/h | INTRAVENOUS | Status: DC
  Filled 2018-08-23 (×2): qty 1000

## 2018-08-23 MED ORDER — LIDOCAINE HCL (PF) 1 % IJ SOLN
INTRAMUSCULAR | Status: AC
  Filled 2018-08-23: qty 30

## 2018-08-23 MED ORDER — DIPHENHYDRAMINE HCL 50 MG/ML IJ SOLN: 12.5000 mg | INTRAMUSCULAR | Status: DC | PRN

## 2018-08-23 MED ORDER — LIDOCAINE HCL (PF) 1 % IJ SOLN
30.0000 mL | INTRAMUSCULAR | Status: DC | PRN
  Filled 2018-08-23: qty 30

## 2018-08-23 MED ORDER — SODIUM CHLORIDE 0.9 % IV SOLN: 5.0000 10*6.[IU] | Freq: Once | INTRAVENOUS | Status: DC

## 2018-08-23 MED ORDER — DIBUCAINE (PERIANAL) 1 % EX OINT: 1.0000 "application " | TOPICAL_OINTMENT | CUTANEOUS | Status: DC | PRN

## 2018-08-23 MED ORDER — PENICILLIN G 3 MILLION UNITS IVPB - SIMPLE MED
3.0000 10*6.[IU] | INTRAVENOUS | Status: DC
  Administered 2018-08-23 (×2): 3 10*6.[IU] via INTRAVENOUS
  Filled 2018-08-23 (×9): qty 100

## 2018-08-23 MED ORDER — ONDANSETRON HCL 4 MG/2ML IJ SOLN: 4.0000 mg | INTRAMUSCULAR | Status: DC | PRN

## 2018-08-23 MED ORDER — IBUPROFEN 600 MG PO TABS
600.0000 mg | ORAL_TABLET | Freq: Four times a day (QID) | ORAL | Status: DC
  Administered 2018-08-23 – 2018-08-25 (×5): 600 mg via ORAL
  Filled 2018-08-23 (×7): qty 1

## 2018-08-23 NOTE — OB Triage Note (Signed)
Recvd pt via EMS. States water broke around 0000 and was brown. Contractions started right after. Dr. Jean Rosenthal at bedside with [place] to confirm vertex position.

## 2018-08-23 NOTE — H&P (Signed)
OB History & Physical   History of Present Illness:  Chief Complaint: ruptured membranes  HPI:  Lindsey Whitney is a 38 y.o. G78P2002 female at [redacted]w[redacted]d dated by an 18 week ultrasound.  Her pregnancy has been complicated by history of cesarean delivery with subsequent successful VBAC, late entry into care in the [place] at 24 week (just moved here), anemia, and AMA. .    She reports contractions.   She reports leakage of fluid.   She denies vaginal bleeding.   She reports fetal movement.    Maternal Medical History:   Past Medical History:  Diagnosis Date  . Medical history non-contributory   . No known health problems     Past Surgical History:  Procedure Laterality Date  . CESAREAN SECTION      No Known Allergies  Prior to Admission medications   Medication Sig Start Date End Date Taking? Authorizing Provider  ferrous sulfate 325 (65 FE) MG tablet Take 325 mg by mouth daily with breakfast.   Yes [provider]  Prenatal Vit-Fe Fumarate-FA (MULTIVITAMIN-PRENATAL) 27-0.8 MG TABS tablet Take 1 tablet by mouth daily.    Yes [provider]    OB History  Gravida Para Term Preterm AB Living  3 2 2     2   SAB TAB Ectopic Multiple Live Births          2    # Outcome Date GA Lbr Len/2nd Weight Sex Delivery Anes PTL Lv  3 Current           2 Term 09/20/11    F VBAC   LIV  1 Term 04/22/07    F CS-LTranv   LIV    Prenatal care site: [facility]  Social History: She  reports that she has never smoked. She has never used smokeless tobacco. She reports that she does not drink alcohol or use drugs.  Family History: She denies a family of gynecologic cancers and birth defects  Review of Systems:  Review of Systems  Constitutional: Negative.   HENT: Negative.   Eyes: Negative.   Respiratory: Negative.   Cardiovascular: Negative.   Gastrointestinal: Positive for abdominal pain (contractions). Negative for blood in stool, constipation, diarrhea, heartburn, melena, nausea  and vomiting.  Genitourinary: Negative.   Musculoskeletal: Negative.   Skin: Negative.   Neurological: Negative.   Psychiatric/Behavioral: Negative.      Physical Exam:  Vital Signs: There were no vitals taken for this visit. Constitutional: Well nourished, well developed female in no acute distress.  HEENT: normal Skin: Warm and dry.  Cardiovascular: Regular rate and rhythm.   Extremity: no edema  Respiratory: Clear to auscultation bilateral. Normal respiratory effort Abdomen: FHT present and gravid, tender with contractions Back: no CVAT Neuro: DTRs 2+, Cranial nerves grossly intact Psych: Alert and Oriented x3. No memory deficits. Normal mood and affect.  MS: normal gait, normal bilateral lower extremity ROM/strength/stability.  Pelvic exam: (female chaperone present) is not limited by body habitus Cervix: 1.5/50/-2, grossly ruptured with light-green meconium staining.    Pertinent Results:  Prenatal Labs: Blood type/Rh O positive  Antibody screen negative  Rubella Immune  Varicella Immune    RPR NR  HBsAg negative  HIV negative  GC negative  Chlamydia negative  Genetic screening Not done (late entry)  1 hour GTT 101  3 hour GTT n/a  GBS positive   Baseline FHR: 125 beats/min   Variability: moderate   Accelerations: present   Decelerations: present (upon applying the fetal  monitors, the fetal heart rate was in the 80s with spontaneous resolution. She is having periodic variable decelerations to the 100s with return to baseline after the contraction ends.  Moderate variability noted (occasional marked variability). Contractions: present frequency: 3 q 10 min Overall assessment: cat 2 (due to variable decelerations)  Bedside Ultrasound:  Number of Fetus: 1  Presentation: cephalic  Placental Location: anterior  Assessment:  Lindsey Whitney is a 38 y.o. 483P2002 female at 6743w3d with SROM with light meconium.  She also has a history of cesarean section with  successful VBAC with G2.   Plan:  1. Admit to Labor & Delivery  2. CBC, T&S, Clrs, IVF 3. GBS positive - PCN 4. Fetwal well-being: overall reassuring. Will monitor closely. Will likely place IUPC for amnioinfusion to relieve the variable decelerations 5. History of cesarean delivery: Previous lengthy discussion held with patient regarding Trial of Labor after Cesarean. This is re-affirmed today, as she would like to proceed. OR and anesthesia along with the pediatric team aware.   6. Patient aware of meaning of meconium. Pediatrics aware.   Thomasene MohairStephen Jackson, MD 08/23/2018 1:53 AM

## 2018-08-23 NOTE — Plan of Care (Signed)
Postpartum vaginal delivery.

## 2018-08-23 NOTE — Discharge Summary (Signed)
OB Discharge Summary     Patient Name: Lindsey Whitney DOB: [birth year] MRN: [id number]  Date of admission: 08/23/2018 Delivering provider: Tresea Mall, CNM  Date of Delivery: 08/23/2018  Date of discharge: 08/24/2018  Admitting diagnosis: Labor Intrauterine pregnancy: [redacted]w[redacted]d     Secondary diagnosis: history of cesarean delivery in 2009 with successful VBAC in 2013     Discharge diagnosis: Term Pregnancy Delivered and VBAC                                                                                                Post partum procedures: None  Augmentation: Pitocin  Complications: None  Hospital course:  Onset of Labor With Vaginal Delivery      38 y.o. yo G3P2002 at [redacted]w[redacted]d was admitted in Latent Labor on 08/23/2018.  Patient had an uncomplicated labor course as follows:  Membrane Rupture Time/Date: 12:00 AM ,08/23/2018   Patient had delivery of viable female 3:12 PM, 08/23/2018 See delivery note for details of delivery  Patient had an uncomplicated postpartum course.  She is tolerating a regular diet. Her pain is well controlled with PO medication. She is ambulating and voiding without difficulty.  Patient is discharged home in stable condition on 08/24/18.   Physical exam  Vitals:   08/23/18 2323 08/23/18 2355 08/24/18 0317 08/24/18 0806  BP: (!) 143/90 116/72 98/67 107/71  Pulse: 90 82 90 78  Resp: 18 18 18 20   Temp: 98.6 F (37 C)  98.5 F (36.9 C) 98 F (36.7 C)  TempSrc: Oral  Oral Oral  SpO2: 100% 99% 100% 99%  Weight:      Height:       General: alert, cooperative and no distress Lochia: appropriate Uterine Fundus: firm Incision: N/A DVT Evaluation: No evidence of DVT seen on physical exam.  Labs: Lab Results  Component Value Date   WBC 11.6 (H) 08/24/2018   HGB 10.5 (L) 08/24/2018   HCT 31.8 (L) 08/24/2018   MCV 92.2 08/24/2018   PLT 187 08/24/2018    Discharge instruction: per After Visit Summary.  Medications:  Allergies as of 08/24/2018   No  Known Allergies     Medication List    TAKE these medications   ferrous sulfate 325 (65 FE) MG tablet Take 325 mg by mouth daily with breakfast.   multivitamin-prenatal 27-0.8 MG Tabs tablet Take 1 tablet by mouth daily.       Diet: routine diet  Activity: Advance as tolerated. Pelvic rest for 6 weeks.   Outpatient follow up: Follow-up Information    [facility] Schedule an appointment as soon as possible for a visit in 6 week(s).   Why:  postpartum follow up appointment Contact information: [address] [place] [address]            Postpartum contraception: Progesterone only pills Rhogam Given postpartum: NA Rubella vaccine given postpartum: Rubella Immune Varicella vaccine given postpartum: Varicella Immune TDaP given antepartum or postpartum: Ordered postpartum  Newborn Data: Live born female Lindsey Whitney Birth Weight: 3340 g  APGAR: 9, 9  Newborn Delivery  Birth date/time:  08/23/2018 15:13:00 Delivery type:  Vaginal, Spontaneous      Baby Feeding: Breast  Disposition: home with mother  SIGNED:  Oswaldo ConroyJacelyn Y Schmid, CNM 08/24/2018 12:36 PM

## 2018-08-23 NOTE — Progress Notes (Addendum)
  Labor Progress Note   38 y.o. [id number] @ [redacted]w[redacted]d , admitted for  Pregnancy, Labor Management. SROM/contractions  Subjective:  Patient is coping well with contractions. She is planning for an epidural.  Objective:  BP 126/74 (BP Location: Right Arm)   Pulse 76   Temp 98.3 F (36.8 C) (Oral)   Resp 17   Ht 5\' 5"  (1.651 m)   Wt 100.2 kg   BMI 36.78 kg/m  Abd: gravid, ND, FHT present, mild tenderness on exam Extr: no edema SVE: CERVIX: 4 cm dilated, 80 effaced, -2 station, cervical sweep  EFM: FHR: 125 bpm, variability: moderate,  accelerations:  Present,  decelerations:  Present occasional variable and some early decelerations. Good variability and good recovery. Toco: Frequency: Every 3-6 minutes Labs: I have reviewed the patient's lab results.   Assessment & Plan:  [id number] @ [redacted]w[redacted]d, admitted for  Pregnancy and Labor/Delivery Management  1. Pain management: position changes/birth ball. 2. FWB: FHT category 2 overall reassuring  3. ID: GBS positive, continue Penicillin for GBS prophylaxis 4. Labor management: expectant management, start pitocin if needed  All discussed with patient, see orders   Tresea Mall, CNM [facility] 08/23/2018  8:37 AM

## 2018-08-23 NOTE — Anesthesia Preprocedure Evaluation (Signed)
Anesthesia Evaluation  Patient identified by MRN, date of birth, ID band Patient awake    Reviewed: Allergy & Precautions, NPO status , Patient's Chart, lab work & pertinent test results  History of Anesthesia Complications Negative for: history of anesthetic complications  Airway Mallampati: II  TM Distance: >3 FB Neck ROM: Full    Dental no notable dental hx.    Pulmonary neg pulmonary ROS, neg sleep apnea, neg COPD,    breath sounds clear to auscultation- rhonchi (-) wheezing      Cardiovascular Exercise Tolerance: Good (-) hypertension(-) CAD and (-) Past MI  Rhythm:Regular Rate:Normal - Systolic murmurs and - Diastolic murmurs    Neuro/Psych negative neurological ROS  negative psych ROS   GI/Hepatic negative GI ROS, Neg liver ROS,   Endo/Other  negative endocrine ROSneg diabetes  Renal/GU negative Renal ROS     Musculoskeletal negative musculoskeletal ROS (+)   Abdominal (+) + obese, Gravid abdomen  Peds  Hematology  (+) anemia ,   Anesthesia Other Findings First pregnancy resulted in CS due to breech position, second delivery was successful VBAC  Reproductive/Obstetrics (+) Pregnancy                             Lab Results  Component Value Date   WBC 10.7 (H) 08/23/2018   HGB 12.4 08/23/2018   HCT 36.8 08/23/2018   MCV 90.6 08/23/2018   PLT 211 08/23/2018    Anesthesia Physical Anesthesia Plan  ASA: II  Anesthesia Plan: Epidural   Post-op Pain Management:    Induction:   PONV Risk Score and Plan: 2  Airway Management Planned:   Additional Equipment:   Intra-op Plan:   Post-operative Plan:   Informed Consent: I have reviewed the patients History and Physical, chart, labs and discussed the procedure including the risks, benefits and alternatives for the proposed anesthesia with the patient or authorized representative who has indicated his/her understanding and  acceptance.       Plan Discussed with: CRNA and Anesthesiologist  Anesthesia Plan Comments: (Plan for epidural for labor, discussed epidural vs spinal vs GA if need for csection)        Anesthesia Quick Evaluation

## 2018-08-23 NOTE — Anesthesia Procedure Notes (Signed)
Epidural Patient location during procedure: OB Start time: 08/23/2018 9:37 AM End time: 08/23/2018 9:50 AM  Staffing Anesthesiologist: Alver Fisher, MD Performed: anesthesiologist   Preanesthetic Checklist Completed: patient identified, site marked, surgical consent, pre-op evaluation, timeout performed, IV checked, risks and benefits discussed and monitors and equipment checked  Epidural Patient position: sitting Prep: ChloraPrep Patient monitoring: heart rate, continuous pulse ox and blood pressure Approach: midline Location: L3-L4 Injection technique: LOR saline  Needle:  Needle type: Tuohy  Needle gauge: 18 G Needle length: 9 cm and 9 Needle insertion depth: 7 cm Catheter type: closed end flexible Catheter size: 20 Guage Catheter at skin depth: 11 cm Test dose: negative (0.125% bupivacaine)  Assessment Events: blood not aspirated, injection not painful, no injection resistance, negative IV test and no paresthesia  Additional Notes   Patient tolerated the insertion well without complications.Reason for block:procedure for pain

## 2018-08-24 LAB — CBC
HCT: 31.8 % — ABNORMAL LOW (ref 36.0–46.0)
Hemoglobin: 10.5 g/dL — ABNORMAL LOW (ref 12.0–15.0)
MCH: 30.4 pg (ref 26.0–34.0)
MCHC: 33 g/dL (ref 30.0–36.0)
MCV: 92.2 fL (ref 80.0–100.0)
Platelets: 187 10*3/uL (ref 150–400)
RBC: 3.45 MIL/uL — ABNORMAL LOW (ref 3.87–5.11)
RDW: 13.8 % (ref 11.5–15.5)
WBC: 11.6 10*3/uL — ABNORMAL HIGH (ref 4.0–10.5)
nRBC: 0 % (ref 0.0–0.2)

## 2018-08-24 LAB — RPR: RPR Ser Ql: NONREACTIVE

## 2018-08-24 NOTE — Lactation Note (Signed)
This note was copied from a baby's chart. Lactation Consultation Note  Patient Name: Lindsey Whitney LSLHT'D Date: 08/24/2018 Reason for consult: Follow-up assessment;Hyperbilirubinemia Observed breast feed with good deep latch, strong rhythmic sucking and swallowing.  Mom is experienced breastfeeding 1st for 4 months and 2nd for 2 years.  Demonstrated hand expression of colostrum easily.  Denies nipple pain.  Coconut oil has been given with instructions in use.  Encouraged mom to continue to put [name] to the breast with any feeding cues.  Reviewed supply and demand, normal course of lactation and routine newborn feeding patterns.  Lactation name and number written on white board and encouraged to call with any questions, concerns or assistance.  Maternal Data Formula Feeding for Exclusion: No Has patient been taught Hand Expression?: Yes(Can easily hand express colostrum) Does the patient have breastfeeding experience prior to this delivery?: Yes  Feeding Feeding Type: Breast Fed  LATCH Score Latch: Grasps breast easily, tongue down, lips flanged, rhythmical sucking.  Audible Swallowing: A few with stimulation  Type of Nipple: Everted at rest and after stimulation  Comfort (Breast/Nipple): Soft / non-tender  Hold (Positioning): No assistance needed to correctly position infant at breast.  LATCH Score: 9  Interventions Interventions: Breast feeding basics reviewed;Assisted with latch;Breast massage;Hand express;Breast compression;Adjust position;Support pillows;Position options;Coconut oil  Lactation Tools Discussed/Used     Consult Status Consult Status: Follow-up Follow-up type: Call as needed    Louis Meckel 08/24/2018, 7:53 PM

## 2018-08-24 NOTE — Anesthesia Postprocedure Evaluation (Signed)
Anesthesia Post Note  Patient: Lindsey Whitney  Procedure(s) Performed: AN AD HOC LABOR EPIDURAL  Patient location during evaluation: Mother Baby Anesthesia Type: Epidural Level of consciousness: awake and alert Pain management: pain level controlled Vital Signs Assessment: post-procedure vital signs reviewed and stable Respiratory status: spontaneous breathing, nonlabored ventilation and respiratory function stable Cardiovascular status: stable Postop Assessment: no headache, no backache and epidural receding Anesthetic complications: no     Last Vitals:  Vitals:   08/24/18 0317 08/24/18 0806  BP: 98/67 107/71  Pulse: 90 78  Resp: 18 20  Temp: 36.9 C 36.7 C  SpO2: 100% 99%    Last Pain:  Vitals:   08/24/18 0806  TempSrc: Oral  PainSc:                  Yevette Edwards

## 2018-08-25 MED ORDER — IBUPROFEN 600 MG PO TABS
600.0000 mg | ORAL_TABLET | Freq: Four times a day (QID) | ORAL | Status: DC
  Administered 2018-08-25: 09:00:00 600 mg via ORAL
  Filled 2018-08-25: qty 1

## 2018-08-25 NOTE — Progress Notes (Signed)
Infant not discharged by pediatrician due to issues with Bilirubin level. Notified CNM. Discharge order cancelled. Will update in AM.

## 2018-08-25 NOTE — Plan of Care (Signed)
Vs stable; up ad lib; tolerating regular diet; taking motrin for pain control; breastfeeding baby and has not asked for any assistance with that this shift; pt does need to see social worker before discharge (pt scored 14 on edinburgh postnatal depression scale); also, pt reports having car seat for the baby but not a car (they are hear visiting from [place])

## 2018-08-25 NOTE — Progress Notes (Signed)
Patient did not discharge yesterday due to baby's elevated bilirubin; discharge order was cancelled. She is doing well this morning and is stable for discharge with baby.  Marcelyn Bruins, CNM 08/25/2018  9:26 AM

## 2018-08-25 NOTE — Progress Notes (Signed)
CSW aware of consult due to MOB's score of 14 on the [place] Depression Screen. CSW spoke with MOB, via telephone, to offer support and complete assessment.   MOB pleasant and easy to engage throughout assessment and reported being excited to discharge and be with her family. MOB shared with CSW that she and her family were visiting from the [place] but have not been able to leave due to COVID19. MOB reported this has caused anxiety throughout MOB's pregnancy. MOB stated she is currently feeling "really grateful" to be able to go home and be with her family. MOB denied any history of, or current, mental health symptoms and denied any history of PPD but was receptive to education. CSW provided education regarding Baby Blues vs PMADs and provided MOB with resources for mental health follow up.  CSW encouraged MOB to evaluate her mental health throughout the postpartum period with the use of the New Mom Checklist developed by Postpartum Progress as well as the [place] Postnatal Depression Scale and notify a medical professional if symptoms arise. MOB denied any SI, HI or DV and reported having a very strong support system consisting of FOB, her sister, her mother, her children and her nieces and nephews. MOB also reported having support from their church. MOB confirmed having all essential items for infant once discharged.  MOB denied any further questions, concerns or need for resources. CSW identifies no further need for intervention and no barriers to discharge at this time.  Archie Balboa, LCSWA  [facility] [phone]

## 2018-08-25 NOTE — Progress Notes (Signed)
Reviewed D/C instructions with pt and family. Pt verbalized understanding of teaching. Discharged to home via W/C. Pt to schedule f/u appt.  

## 2019-05-09 IMAGING — US OBSTETRIC 14+ WK ULTRASOUND
1 series · 12 of 28 positions shown · non-contrast
Comparison: none

Addendum:
CLINICAL DATA: Size greater than dates. Current assigned
gestational age of 38 weeks 4 days per ordering provider.

EXAM:
OBSTETRICAL ULTRASOUND >14 WKS

[Series 1: obstetric 14+ wk ultrasound · 0.23mm/px · 12 of 202 slices shown]
[im 8/202]
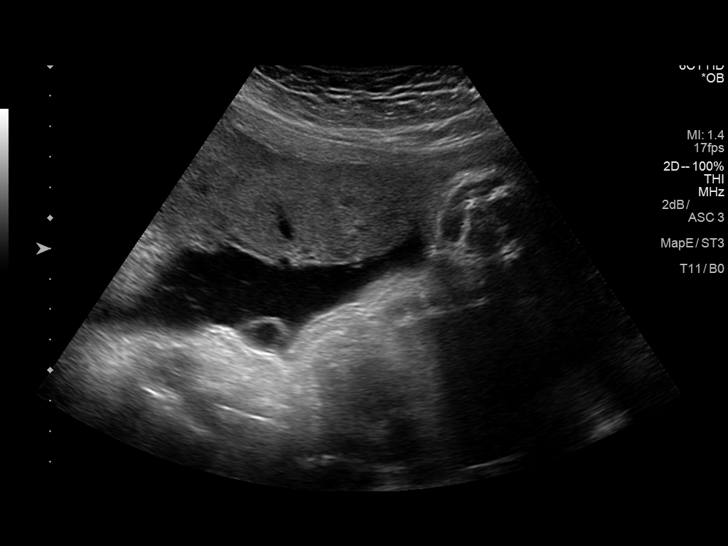
[im 23/202]
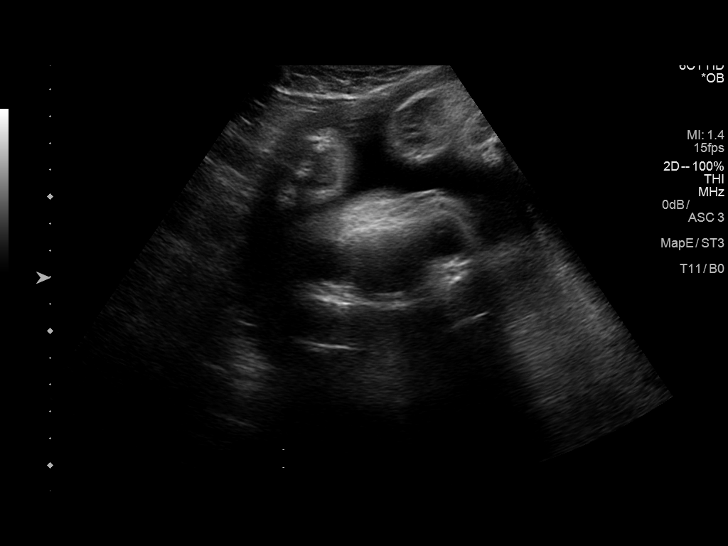
[im 38/202]
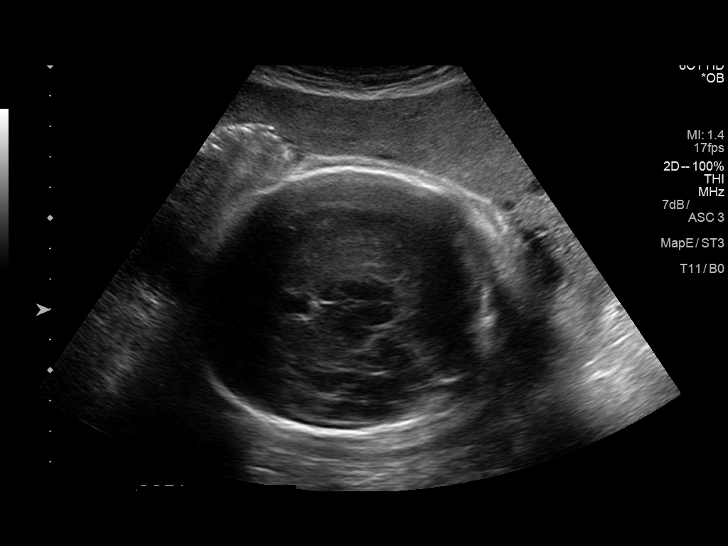
[im 60/202]
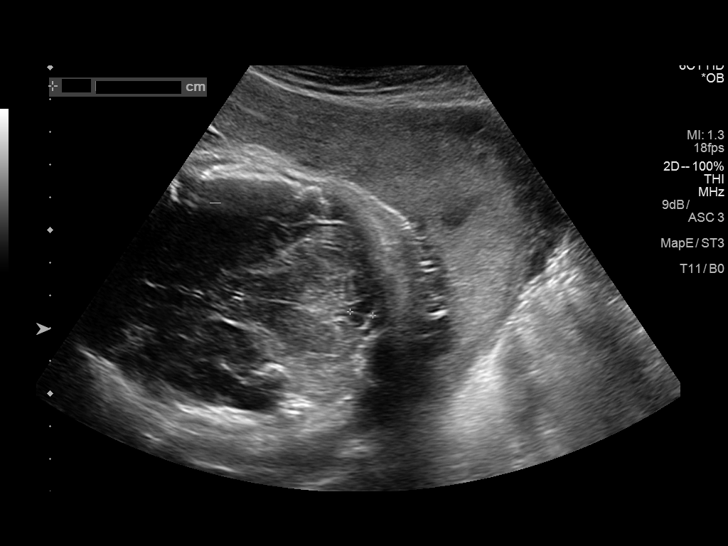
[im 75/202]
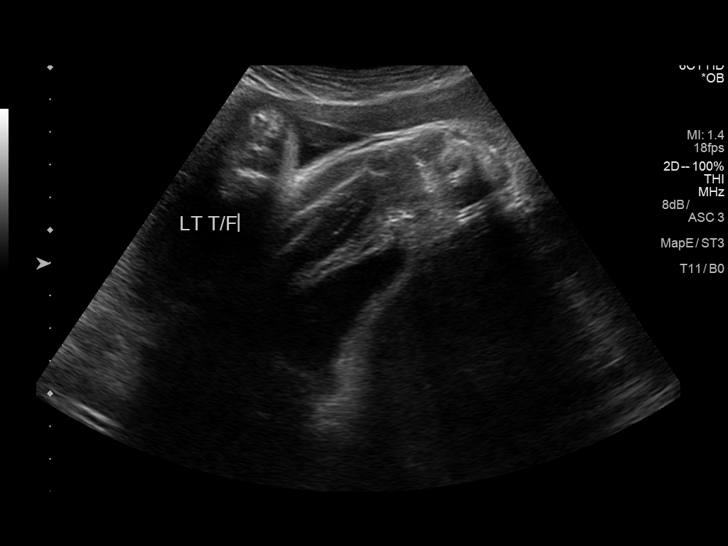
[im 90/202]
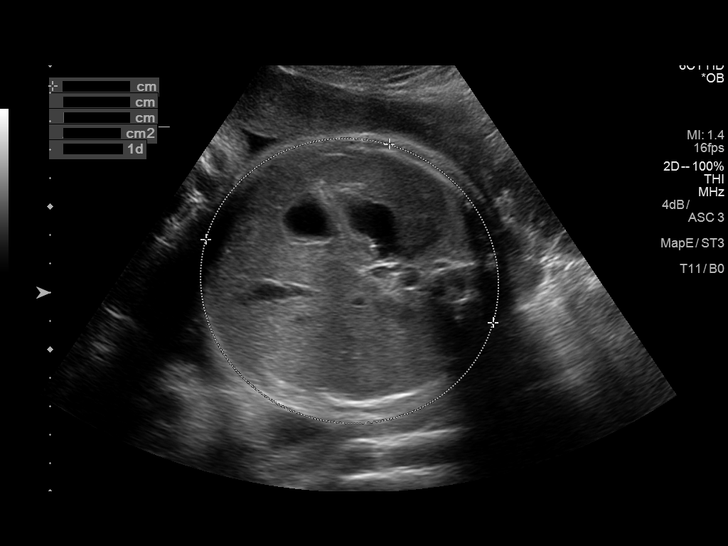
[im 112/202]
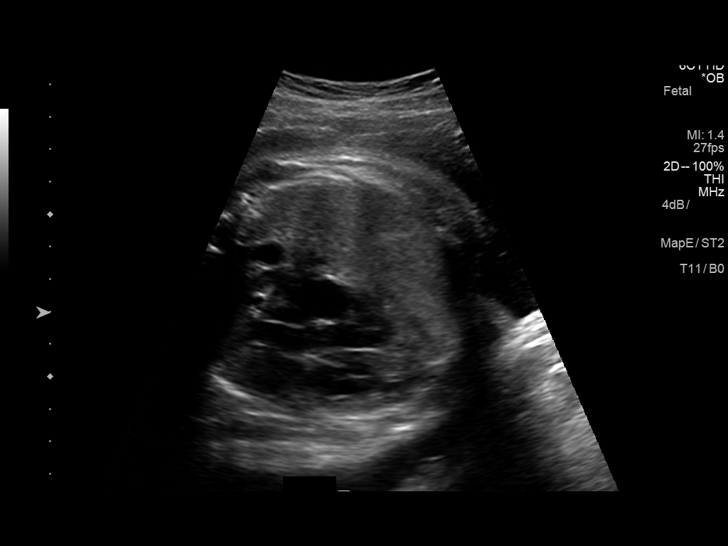
[im 127/202]
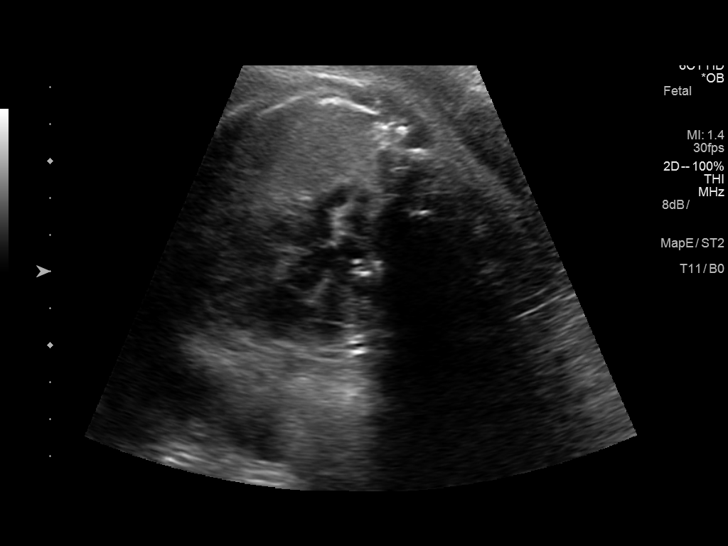
[im 142/202]
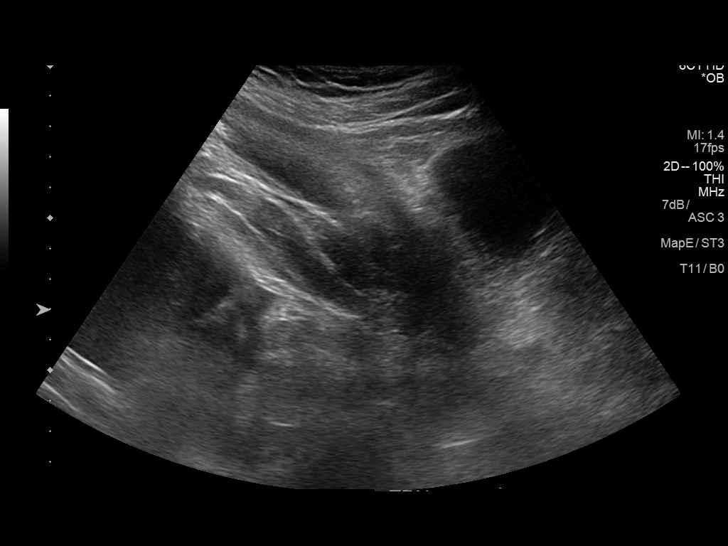
[im 164/202]
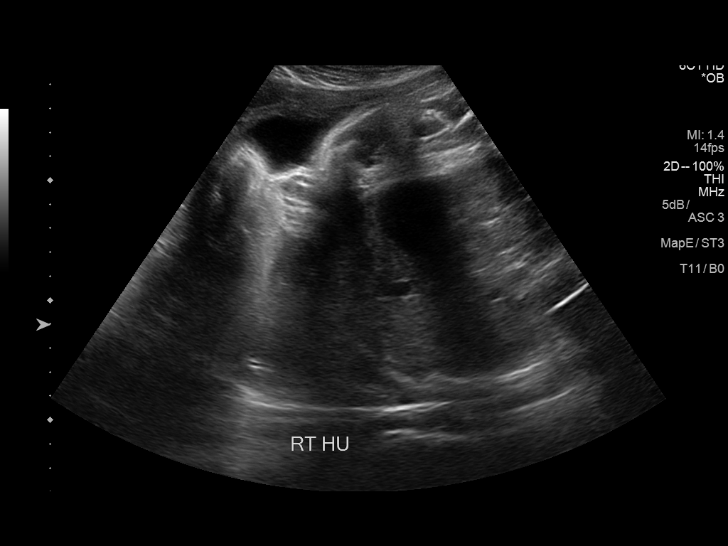
[im 179/202]
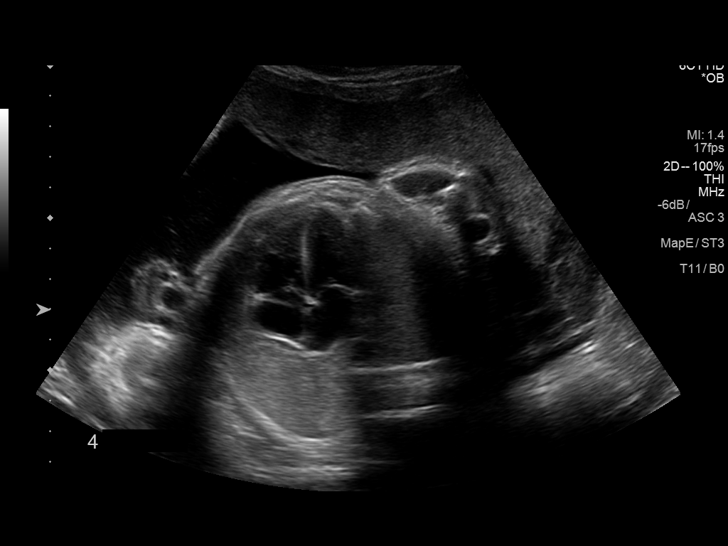
[im 194/202]
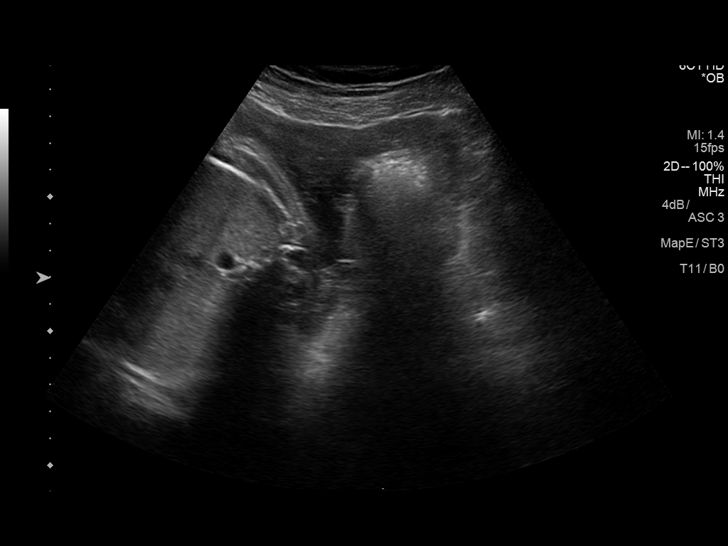

[12 of 28 positions shown; findings below may reference images not displayed]

FINDINGS: Number of Fetuses: 1

Heart Rate:  140 bpm

Movement: Yes

Presentation: Breech

Previa: No

Placental Location: Anterior

Amniotic Fluid (Subjective): Within normal limits

Amniotic Fluid (Objective):

AFI = 13.1 cm

FETAL BIOMETRY

BPD: 8.7cm 35w 0d

HC:   31.5cm 35w 2d

AC:   32.2cm 36w 1d

FL:   6.8cm 34w 6d

Current Mean GA: 35w 2d US EDC: 08/28/2018

Assigned GA:  38w 4 d Assigned EDC: 08/05/2018

Estimated Fetal Weight: 2,718g 7%ile for assigned gestational age of
38 weeks 4 days

FETAL ANATOMY

Lateral Ventricles: Appears normal

Thalami/CSP: Appears normal

Posterior Fossa:  Appears normal

Nuchal Region: Not visualized   NFT= N/A > 20 WKS

Upper Lip: Appears normal

Spine: Not visualized

4 Chamber Heart on Left: Appears normal

LVOT: Appears normal

RVOT: Appears normal

Stomach on Left: Appears normal

3 Vessel Cord: Appears normal

Cord Insertion site: Not visualized

Kidneys: Appears normal

Bladder: Appears normal

Extremities: Appears normal

Sex: Male

Technically difficult due to: Advanced gestational age and fetal
position

Maternal Findings:

Cervix:  3.3 cm TA
IMPRESSION: Assigned gestational age from provider is currently 38 weeks 4 days.
If this is correct, fetus is small for gestational age with EFW
currently at 7 %ile.

Amniotic fluid volume within normal limits, with AFI of 13.1 cm.

No fetal anomalies identified, however, anatomic survey is
incomplete with nuchal region, lower spine, and abdominal cord
insertion site not visualized.

ADDENDUM:
Subsequent clinical data provided is that the patient's Assigned EDC
is 08/20/2018 from ultrasound performed in [place]. Today's US EDC of
08/28/2018 is only 8 days behind this Assigned EDC, and therefore
the fetus is not small for gestational age.

*** End of Addendum ***
FINDINGS: Number of Fetuses: 1

Heart Rate:  140 bpm

Movement: Yes

Presentation: Breech

Previa: No

Placental Location: Anterior

Amniotic Fluid (Subjective): Within normal limits

Amniotic Fluid (Objective):

AFI = 13.1 cm

FETAL BIOMETRY

BPD: 8.7cm 35w 0d

HC:   31.5cm 35w 2d

AC:   32.2cm 36w 1d

FL:   6.8cm 34w 6d

Current Mean GA: 35w 2d US EDC: 08/28/2018

Assigned GA:  38w 4 d Assigned EDC: 08/05/2018

Estimated Fetal Weight: 2,718g 7%ile for assigned gestational age of
38 weeks 4 days

FETAL ANATOMY

Lateral Ventricles: Appears normal

Thalami/CSP: Appears normal

Posterior Fossa:  Appears normal

Nuchal Region: Not visualized   NFT= N/A > 20 WKS

Upper Lip: Appears normal

Spine: Not visualized

4 Chamber Heart on Left: Appears normal

LVOT: Appears normal

RVOT: Appears normal

Stomach on Left: Appears normal

3 Vessel Cord: Appears normal

Cord Insertion site: Not visualized

Kidneys: Appears normal

Bladder: Appears normal

Extremities: Appears normal

Sex: Male

Technically difficult due to: Advanced gestational age and fetal
position

Maternal Findings:

Cervix:  3.3 cm TA
IMPRESSION: Assigned gestational age from provider is currently 38 weeks 4 days.
If this is correct, fetus is small for gestational age with EFW
currently at 7 %ile.

Amniotic fluid volume within normal limits, with AFI of 13.1 cm.

No fetal anomalies identified, however, anatomic survey is
incomplete with nuchal region, lower spine, and abdominal cord
insertion site not visualized.

## 2019-10-02 LAB — OB RESULTS CONSOLE GBS: GBS: POSITIVE

## 2019-10-13 ENCOUNTER — Other Ambulatory Visit: Payer: Self-pay

## 2019-10-17 ENCOUNTER — Ambulatory Visit (LOCAL_COMMUNITY_HEALTH_CENTER): Payer: Self-pay

## 2019-10-17 ENCOUNTER — Other Ambulatory Visit: Payer: Self-pay

## 2019-10-17 VITALS — BP 112/70 | Ht 66.0 in | Wt 196.0 lb

## 2019-10-17 DIAGNOSIS — Z3201 Encounter for pregnancy test, result positive: Secondary | ICD-10-CM

## 2019-10-17 LAB — PREGNANCY, URINE: Preg Test, Ur: POSITIVE — AB

## 2019-10-21 NOTE — Progress Notes (Signed)
Record abstracted per 10/21/19 phone interview Hal Hope, RN; Sharlette Dense, RN

## 2019-10-22 ENCOUNTER — Encounter: Payer: Self-pay | Admitting: Advanced Practice Midwife

## 2019-10-22 ENCOUNTER — Other Ambulatory Visit: Payer: Self-pay

## 2019-10-22 ENCOUNTER — Other Ambulatory Visit: Payer: Self-pay | Admitting: Advanced Practice Midwife

## 2019-10-22 ENCOUNTER — Ambulatory Visit: Payer: Medicaid Other | Admitting: Advanced Practice Midwife

## 2019-10-22 ENCOUNTER — Telehealth: Payer: Self-pay

## 2019-10-22 DIAGNOSIS — O09521 Supervision of elderly multigravida, first trimester: Secondary | ICD-10-CM | POA: Diagnosis not present

## 2019-10-22 DIAGNOSIS — Z369 Encounter for antenatal screening, unspecified: Secondary | ICD-10-CM

## 2019-10-22 DIAGNOSIS — O0991 Supervision of high risk pregnancy, unspecified, first trimester: Secondary | ICD-10-CM | POA: Diagnosis not present

## 2019-10-22 DIAGNOSIS — Z98891 History of uterine scar from previous surgery: Secondary | ICD-10-CM

## 2019-10-22 DIAGNOSIS — O99210 Obesity complicating pregnancy, unspecified trimester: Secondary | ICD-10-CM

## 2019-10-22 DIAGNOSIS — O99211 Obesity complicating pregnancy, first trimester: Secondary | ICD-10-CM | POA: Diagnosis not present

## 2019-10-22 LAB — URINALYSIS
Bilirubin, UA: NEGATIVE
Glucose, UA: NEGATIVE
Ketones, UA: NEGATIVE
Nitrite, UA: NEGATIVE
Protein,UA: NEGATIVE
RBC, UA: NEGATIVE
Specific Gravity, UA: 1.015 (ref 1.005–1.030)
Urobilinogen, Ur: 0.2 mg/dL (ref 0.2–1.0)
pH, UA: 7 (ref 5.0–7.5)

## 2019-10-22 LAB — WET PREP FOR TRICH, YEAST, CLUE
Trichomonas Exam: NEGATIVE
Yeast Exam: NEGATIVE

## 2019-10-22 LAB — HEMOGLOBIN, FINGERSTICK: Hemoglobin: 11.6 g/dL (ref 11.1–15.9)

## 2019-10-22 NOTE — Progress Notes (Signed)
[facility] HEALTH DEPT [facility] [address] [place] [address] [phone]  INITIAL PRENATAL VISIT NOTE  Subjective:  Lindsey Whitney is a 39 y.o. MBF G5P3003 at [redacted]w[redacted]d being seen today to start prenatal care at the [facility]. She feels "happy" about surprise pregnancy with no birth control.  38 yo unemployed husband of 11 years feels "ecstatic" about pregnancy.She has been in [place] since 03/2018 and is unemployed, living with husband and 3 children.  Financial assistance from "friends and family".  LMP 07/27/19.  Last pap 04/29/18 ASCUS HPV neg.  Denies cigs, MJ; last ETOH 2019 (2 ciders). C/o N&V last 3 days ago. She is currently monitored for the following issues for this high-risk pregnancy and has Supervision of high risk pregnancy in first trimester; Supervision of elderly multigravida in first trimester 39 yo; History of vaginal birth after cesarean--desires TOLAC; and Obesity affecting pregnancy BMI=31.6 on their problem list.  Patient reports no complaints.  Contractions: Not present. Vag. Bleeding: None.  Movement: Absent. Denies leaking of fluid.   Indications for ASA therapy (per uptodate) One of the following: Previous pregnancy with preeclampsia, especially early onset and with an adverse outcome No Multifetal gestation No Chronic hypertension No Type 1 or 2 diabetes mellitus No Chronic kidney disease No Autoimmune disease (antiphospholipid syndrome, systemic lupus erythematosus) No  Two or more of the following: Nulliparity No Obesity (body mass index >30 kg/m2) Yes Family history of preeclampsia in mother or sister No Age ?35 years Yes Sociodemographic characteristics (African American race, low socioeconomic level) Yes Personal risk factors (eg, previous pregnancy with low birth weight or small for gestational age infant, previous adverse pregnancy outcome [eg, stillbirth], interval >10 years  between pregnancies) No   The following portions of the patient's history were reviewed and updated as appropriate: allergies, current medications, past family history, past medical history, past social history, past surgical history and problem list. Problem list updated.  Objective:   Vitals:   10/22/19 0851  BP: 120/80  Pulse: 68  Temp: 97.9 F (36.6 C)  Weight: 195 lb 12.8 oz (88.8 kg)    Fetal Status: Fetal Heart Rate (bpm): 160 Fundal Height: 14 cm Movement: Absent  Presentation: Undeterminable   Physical Exam Vitals and nursing note reviewed.  Constitutional:      General: She is not in acute distress.    Appearance: Normal appearance. She is well-developed. She is obese.  HENT:     Head: Normocephalic and atraumatic.     Right Ear: External ear normal.     Left Ear: External ear normal.     Nose: Nose normal. No congestion or rhinorrhea.     Mouth/Throat:     Lips: Pink.     Mouth: Mucous membranes are moist.     Dentition: Normal dentition. No dental caries.     Pharynx: Oropharynx is clear. Uvula midline.  Eyes:     General: No scleral icterus.    Conjunctiva/sclera: Conjunctivae normal.  Neck:     Thyroid: No thyroid mass or thyromegaly.  Cardiovascular:     Rate and Rhythm: Normal rate.     Pulses: Normal pulses.     Comments: Extremities are warm and well perfused Pulmonary:     Effort: Pulmonary effort is normal.     Breath sounds: Normal breath sounds.  Chest:     Breasts: Breasts are symmetrical.        Right: Normal. No mass, nipple discharge  or skin change.        Left: Normal. No mass, nipple discharge or skin change.  Abdominal:     Palpations: Abdomen is soft.     Tenderness: There is no abdominal tenderness.     Comments: Gravid   Genitourinary:    General: Normal vulva.     Exam position: Lithotomy position.     Pubic Area: No rash.      Labia:        Right: No rash.        Left: No rash.      Vagina: Normal. No vaginal discharge  (creamy white leukorrhea, ph<4.5).     Cervix: No cervical motion tenderness or friability.     Uterus: Normal. Enlarged (Gravid 14 wk size). Not tender.      Adnexa: Right adnexa normal and left adnexa normal.     Rectum: Normal. No external hemorrhoid.  Musculoskeletal:     Right lower leg: No edema.     Left lower leg: No edema.  Lymphadenopathy:     Upper Body:     Right upper body: No axillary adenopathy.     Left upper body: No axillary adenopathy.  Skin:    General: Skin is warm and dry.     Capillary Refill: Capillary refill takes less than 2 seconds.  Neurological:     Mental Status: She is alert.     Assessment and Plan:  Pregnancy: G4P3003 at [redacted]w[redacted]d  1. Supervision of high risk pregnancy in first trimester FIRST screen and genetic counseling ordered  - Glucose, 1 hour gestational - Hgb A1c w/o eAG - HIV Antibody (routine testing w rflx) - HCV Ab w/Rflx to Verification - Comprehensive metabolic panel - Prenatal profile without Varicella/Rubella ([id number]) - Protein / Creatinine Ratio, Urine - TSH - Urine Culture - Chlamydia/GC NAA, Confirmation - 492010 Drug Screen - WET PREP FOR TRICH, YEAST, CLUE - Hemoglobin, venipuncture - Urinalysis (Urine Dip)  2. Supervision of elderly multigravida in first trimester 39 yo   3. History of vaginal birth after cesarean Pt desires TOLAC  4. Obesity affecting pregnancy, antepartum Pt declines ASA 81 mg daily after counseling Pt counseled on weight gain of 11-20 lbs    Discussed overview of care and coordination with inpatient delivery practices including [facility], [facility], [facility] and [facility].   Reviewed Centering pregnancy as standard of care at [facility], oriented to room and showed video. Based on EDD, plan for Cycle   Preterm labor symptoms and general obstetric precautions including but not limited to vaginal bleeding, contractions, leaking of fluid and fetal movement were reviewed in detail with the  patient.  Please refer to After Visit Summary for other counseling recommendations.   Return in about 4 weeks (around 11/19/2019) for routine PNC.  Future Appointments  Date Time Provider Department Center  11/19/2019  8:40 AM [facility] PROVIDER [facility] None    Alberteen Spindle, CNM

## 2019-10-22 NOTE — Progress Notes (Signed)
[place] results reviewed. Per standing orders no treatment indicated. Shaima Sardinas, RN  

## 2019-10-22 NOTE — Telephone Encounter (Signed)
Phone call to patient to inform of scheduled [facility] 10/27/19 GC and [place] appt.@ 1:00 and 2:00. Informed of above scheduled appt. Instructed to arrive at 12:45 and enter in through [facility]. Patient verbalized understanding of same. Tawny Hopping, RN

## 2019-10-22 NOTE — Progress Notes (Addendum)
Here today for 12.3 week MH IP. Taking PNV QD. Denies ED/hospital visits since +PT. Early 1hgtt today. Tawny Hopping, RN

## 2019-10-23 LAB — COMPREHENSIVE METABOLIC PANEL
ALT: 8 IU/L (ref 0–32)
AST: 15 IU/L (ref 0–40)
Albumin/Globulin Ratio: 1.6 (ref 1.2–2.2)
Albumin: 4.7 g/dL (ref 3.8–4.8)
Alkaline Phosphatase: 65 IU/L (ref 48–121)
BUN/Creatinine Ratio: 18 (ref 9–23)
BUN: 10 mg/dL (ref 6–20)
Bilirubin Total: 0.3 mg/dL (ref 0.0–1.2)
CO2: 20 mmol/L (ref 20–29)
Calcium: 9.4 mg/dL (ref 8.7–10.2)
Chloride: 102 mmol/L (ref 96–106)
Creatinine, Ser: 0.56 mg/dL — ABNORMAL LOW (ref 0.57–1.00)
GFR calc Af Amer: 138 mL/min/{1.73_m2} (ref >59)
GFR calc non Af Amer: 119 mL/min/{1.73_m2} (ref >59)
Globulin, Total: 2.9 g/dL (ref 1.5–4.5)
Glucose: 73 mg/dL (ref 65–99)
Potassium: 3.6 mmol/L (ref 3.5–5.2)
Sodium: 136 mmol/L (ref 134–144)
Total Protein: 7.6 g/dL (ref 6.0–8.5)

## 2019-10-23 LAB — CBC/D/PLT+RPR+RH+ABO+AB SCR
Antibody Screen: NEGATIVE
Basophils Absolute: 0.1 10*3/uL (ref 0.0–0.2)
Basos: 1 %
EOS (ABSOLUTE): 0.2 10*3/uL (ref 0.0–0.4)
Eos: 3 %
Hematocrit: 36.2 % (ref 34.0–46.6)
Hemoglobin: 11.4 g/dL (ref 11.1–15.9)
Hepatitis B Surface Ag: NEGATIVE
Immature Grans (Abs): 0 10*3/uL (ref 0.0–0.1)
Immature Granulocytes: 0 %
Lymphocytes Absolute: 2.2 10*3/uL (ref 0.7–3.1)
Lymphs: 27 %
MCH: 29.6 pg (ref 26.6–33.0)
MCHC: 31.5 g/dL (ref 31.5–35.7)
MCV: 94 fL (ref 79–97)
Monocytes Absolute: 0.5 10*3/uL (ref 0.1–0.9)
Monocytes: 6 %
Neutrophils Absolute: 5.3 10*3/uL (ref 1.4–7.0)
Neutrophils: 63 %
Platelets: 251 10*3/uL (ref 150–450)
RBC: 3.85 x10E6/uL (ref 3.77–5.28)
RDW: 12.9 % (ref 11.7–15.4)
RPR Ser Ql: NONREACTIVE
Rh Factor: POSITIVE
WBC: 8.3 10*3/uL (ref 3.4–10.8)

## 2019-10-23 LAB — 789231 7+OXYCODONE-BUND
Amphetamines, Urine: NEGATIVE ng/mL
BENZODIAZ UR QL: NEGATIVE ng/mL
Barbiturate screen, urine: NEGATIVE ng/mL
Cannabinoid Quant, Ur: NEGATIVE ng/mL
Cocaine (Metab.): NEGATIVE ng/mL
OPIATE SCREEN URINE: NEGATIVE ng/mL
Oxycodone/Oxymorphone, Urine: NEGATIVE ng/mL
PCP Quant, Ur: NEGATIVE ng/mL

## 2019-10-23 LAB — HCV INTERPRETATION

## 2019-10-23 LAB — HGB A1C W/O EAG: Hgb A1c MFr Bld: 5.5 % (ref 4.8–5.6)

## 2019-10-23 LAB — HIV ANTIBODY (ROUTINE TESTING W REFLEX): HIV Screen 4th Generation wRfx: NONREACTIVE

## 2019-10-23 LAB — PROTEIN / CREATININE RATIO, URINE
Creatinine, Urine: 46 mg/dL
Protein, Ur: 8 mg/dL
Protein/Creat Ratio: 174 mg/g creat (ref 0–200)

## 2019-10-23 LAB — TSH: TSH: 2.71 u[IU]/mL (ref 0.450–4.500)

## 2019-10-23 LAB — GLUCOSE, 1 HOUR GESTATIONAL: Gestational Diabetes Screen: 65 mg/dL (ref 65–139)

## 2019-10-23 LAB — HCV AB W/RFLX TO VERIFICATION: HCV Ab: <0.1 s/co ratio (ref 0.0–0.9)

## 2019-10-23 NOTE — Progress Notes (Signed)
Reviewed hgb A1c = 5.5%; no action needed.

## 2019-10-24 LAB — URINE CULTURE

## 2019-10-25 LAB — CHLAMYDIA/GC NAA, CONFIRMATION
Chlamydia trachomatis, NAA: NEGATIVE
Neisseria gonorrhoeae, NAA: NEGATIVE

## 2019-10-27 ENCOUNTER — Ambulatory Visit (HOSPITAL_BASED_OUTPATIENT_CLINIC_OR_DEPARTMENT_OTHER): Payer: Self-pay

## 2019-10-27 ENCOUNTER — Other Ambulatory Visit: Payer: Self-pay | Admitting: Advanced Practice Midwife

## 2019-10-27 ENCOUNTER — Other Ambulatory Visit: Payer: Self-pay

## 2019-10-27 ENCOUNTER — Telehealth: Payer: Self-pay

## 2019-10-27 ENCOUNTER — Encounter: Payer: Self-pay | Admitting: Family Medicine

## 2019-10-27 ENCOUNTER — Other Ambulatory Visit
Admission: RE | Admit: 2019-10-27 | Discharge: 2019-10-27 | Disposition: A | Discharge status: Home / Self Care | Payer: Self-pay | Source: Ambulatory Visit | Attending: Obstetrics | Admitting: Obstetrics

## 2019-10-27 ENCOUNTER — Encounter: Payer: Self-pay | Admitting: Advanced Practice Midwife

## 2019-10-27 DIAGNOSIS — O09521 Supervision of elderly multigravida, first trimester: Secondary | ICD-10-CM

## 2019-10-27 DIAGNOSIS — Z369 Encounter for antenatal screening, unspecified: Secondary | ICD-10-CM

## 2019-10-27 DIAGNOSIS — O0991 Supervision of high risk pregnancy, unspecified, first trimester: Secondary | ICD-10-CM

## 2019-10-27 DIAGNOSIS — Z3A13 13 weeks gestation of pregnancy: Secondary | ICD-10-CM

## 2019-10-27 DIAGNOSIS — Z3A14 14 weeks gestation of pregnancy: Secondary | ICD-10-CM

## 2019-10-27 DIAGNOSIS — O2340 Unspecified infection of urinary tract in pregnancy, unspecified trimester: Secondary | ICD-10-CM | POA: Insufficient documentation

## 2019-10-27 NOTE — Telephone Encounter (Signed)
Reached client-scheduled appt. 10/28/19 for UTI Tx Sharlette Dense, RN

## 2019-10-27 NOTE — Telephone Encounter (Signed)
+  UTI, per Hazle Coca, CNM, attempted to call client; no answer, left voicemail message Sharlette Dense, RN  No answer @ home # Sharlette Dense, RN

## 2019-10-27 NOTE — Progress Notes (Signed)
Referring Provider:  [facility] Department Length of Consultation: 40 minutes  Lindsey Whitney was referred to Maternal Fetal Care at [facility] for genetic counseling because of advanced maternal age.  The patient will be 39 years old at the time of delivery.  This note summarizes the information we discussed.    We explained that the chance of a chromosome abnormality increases with maternal age.  Chromosomes and examples of chromosome problems were reviewed.  Humans typically have 46 chromosomes in each cell, with half passed through each sperm and egg.  Any change in the number or structure of chromosomes can increase the risk of problems in the physical and mental development of a pregnancy.   Based upon age of the patient, the chance of any chromosome abnormality was 1 in 27. The chance of Down syndrome, the most common chromosome problem associated with maternal age, was 1 in 76.  The risk of chromosome problems is in addition to the 3% general population risk for birth defects and intellectual disabilities.  The greatest chance, of course, is that the baby would be born in good health.  We discussed the following prenatal screening and testing options for this pregnancy:  Cell free fetal DNA testing analyzes maternal blood to determine whether or not the baby may have Down syndrome, trisomy 51, or trisomy 73.  This test utilizes a maternal blood sample and DNA sequencing technology to isolate circulating cell free fetal DNA from maternal plasma.  The fetal DNA can then be analyzed for DNA sequences that are derived from the three most common chromosomes involved in aneuploidy, chromosomes 13, 18, and 21.  If the overall amount of DNA is greater than the expected level for any of these chromosomes, aneuploidy is suspected.  While we do not consider it a replacement for invasive testing and karyotype analysis, a negative result from this testing would be reassuring, though not a  guarantee of a normal chromosome complement for the baby.  An abnormal result is certainly suggestive of an abnormal chromosome complement, though we would still recommend CVS or amniocentesis to confirm any findings from this testing.  First trimester screening, is another blood test which includes nuchal translucency ultrasound screen and first trimester maternal serum marker screening.  The nuchal translucency has approximately an 80% detection rate for Down syndrome and can be positive for other chromosome abnormalities as well as heart defects.  When combined with a maternal serum marker screening, the detection rate is up to 90% for Down syndrome and up to 97% for trisomy 18.     The chorionic villus sampling procedure is available for first trimester chromosome analysis.  This involves the withdrawal of a small amount of chorionic villi (tissue from the developing placenta).  Risk of pregnancy loss is estimated to be approximately 1 in 200 to 1 in 100 (0.5 to 1%).  There is approximately a 1% (1 in 100) chance that the CVS chromosome results will be unclear.  Chorionic villi cannot be tested for neural tube defects.     Maternal serum marker screening, a blood test that measures pregnancy proteins, can provide risk assessments for Down syndrome, trisomy 18, and open neural tube defects (spina bifida, anencephaly). Because it does not directly examine the fetus, it cannot positively diagnose or rule out these problems. The detection rate is approximately 75% for Down syndrome, 70% for trisomy 18 and 80% of open neural tube defects. If prior chromosome screening has been performed, then AFP only is  recommended to test for open neural tube defects alone.  Targeted ultrasound uses high frequency sound waves to create an image of the developing fetus.  An ultrasound is often recommended as a routine means of evaluating the pregnancy.  It is also used to screen for fetal anatomy problems (for example, a  heart defect) that might be suggestive of a chromosomal or other abnormality.   Amniocentesis involves the removal of a small amount of amniotic fluid from the sac surrounding the fetus with the use of a thin needle inserted through the maternal abdomen and uterus.  Ultrasound guidance is used throughout the procedure.  Fetal cells from amniotic fluid are directly evaluated and > 99.5% of chromosome problems and > 98% of open neural tube defects can be detected. This procedure is generally performed after the 15th week of pregnancy.  The main risks to this procedure include complications leading to miscarriage in less than 1 in 200 cases (0.5%).  Cystic Fibrosis and Spinal Muscular Atrophy (SMA) screening were also discussed with the patient. Both conditions are recessive, which means that both parents must be carriers in order to have a child with the disease.  Cystic fibrosis (CF) is one of the most common genetic conditions in persons of Caucasian ancestry.  This condition occurs in approximately 1 in 2,500 Caucasian persons and results in thickened secretions in the lungs, digestive, and reproductive systems.  For a baby to be at risk for having CF, both of the parents must be carriers for this condition.  Approximately 1 in 68 Caucasian persons is a carrier for CF.  Current carrier testing looks for the most common mutations in the gene for CF and can detect approximately 90% of carriers in the Caucasian population.  This means that the carrier screening can greatly reduce, but cannot eliminate, the chance for an individual to have a child with CF.  If an individual is found to be a carrier for CF, then carrier testing would be available for the partner. As part of [place]'s newborn screening profile, all babies born in the state of [place] will have a two-tier screening process.  Specimens are first tested to determine the concentration of immunoreactive trypsinogen (IRT).  The top 5% of  specimens with the highest IRT values then undergo DNA testing using a panel of over 40 common CF mutations. SMA is a neurodegenerative disorder that leads to atrophy of skeletal muscle and overall weakness.  This condition is also more prevalent in the Caucasian population, with 1 in 40-1 in 60 persons being a carrier and 1 in 6,000-1 in 10,000 children being affected.  There are multiple forms of the disease, with some causing death in infancy to other forms with survival into adulthood.  The genetics of SMA is complex, but carrier screening can detect up to 95% of carriers in the Caucasian population.  Similar to CF, a negative result can greatly reduce, but cannot eliminate, the chance to have a child with SMA.  Hemoglobinopathy screening was also offered to the patient.  We obtained a detailed family history and pregnancy history.  This is the fifth pregnancy for Lindsey Whitney and her partner.  They have three healthy children, ages 12 years, 8 years and 14 months. They had one early miscarriage.  In the current pregnancy, she reported no complications or exposures to medications, alcohol, tobacco or recreational drugs. The remainder of the family history is unremarkable for birth defects, developmental delays, recurrent pregnancy loss or known chromosome  abnormalities.  After consideration of the options, Lindsey Whitney elected to proceed with an ultrasound and MaterniT21 PLUS with [facility].  She declined carrier screening for CF and SMA.  Prior testing through [facility] is documented as "negative" testing for sickle cell in a prior pregnancy.  An ultrasound was performed at the time of the visit.  The gestational age was consistent with 14 weeks.  Fetal anatomy could not be assessed due to early gestational age.  Please refer to the ultrasound report for details of that study.  Lindsey Whitney was encouraged to call with questions or concerns.  We can be contacted at [phone].   Tests Ordered: MaterniT21 PLUS with  [facility].  Cherly Anderson, MS, CGC

## 2019-10-27 NOTE — Progress Notes (Signed)
EDD changed to 04/23/20 based on [redacted]w[redacted]d [place] results on 10/27/19.

## 2019-10-28 ENCOUNTER — Ambulatory Visit: Payer: Medicaid Other | Admitting: Advanced Practice Midwife

## 2019-10-28 VITALS — BP 124/76 | HR 96 | Temp 98.7°F | Wt 194.6 lb

## 2019-10-28 DIAGNOSIS — O2341 Unspecified infection of urinary tract in pregnancy, first trimester: Secondary | ICD-10-CM | POA: Diagnosis not present

## 2019-10-28 DIAGNOSIS — O0991 Supervision of high risk pregnancy, unspecified, first trimester: Secondary | ICD-10-CM | POA: Diagnosis not present

## 2019-10-28 MED ORDER — AMOXICILLIN 250 MG PO CAPS: 250.0000 mg | ORAL_CAPSULE | Freq: Three times a day (TID) | ORAL | 0 refills | Status: DC

## 2019-10-28 NOTE — Progress Notes (Signed)
In for UTI tx. Taking PNV, denies ED/ Hospital visits. Sharlyne Pacas, RN

## 2019-10-28 NOTE — Progress Notes (Signed)
39 yo G5P3013 currently 13 2/7 here for UTI tx from 10/22/19.  -CVAT.  Given Amoxicillin 250 mg po TID x 7 days #21 from our [facility] pharmacy with instructions.  Drinks 12 oz coke/wk.

## 2019-10-31 LAB — MATERNIT21 PLUS CORE+SCA
Fetal Fraction: 12
Monosomy X (Turner Syndrome): NOT DETECTED
Result (T21): NEGATIVE
Trisomy 13 (Patau syndrome): NEGATIVE
Trisomy 18 (Edwards syndrome): NEGATIVE
Trisomy 21 (Down syndrome): NEGATIVE
XXX (Triple X Syndrome): NOT DETECTED
XXY (Klinefelter Syndrome): NOT DETECTED
XYY (Jacobs Syndrome): NOT DETECTED

## 2019-11-02 NOTE — Progress Notes (Signed)
Chart reviewed by Pharmacist  Suzanne Walker PharmD, Contract Pharmacist at [facility]  

## 2019-11-03 ENCOUNTER — Encounter: Payer: Self-pay | Admitting: Advanced Practice Midwife

## 2019-11-06 ENCOUNTER — Telehealth: Payer: Self-pay | Admitting: Obstetrics and Gynecology

## 2019-11-06 NOTE — Telephone Encounter (Signed)
The patient was informed of the results of her recent MaterniT21 testing which yielded NEGATIVE results.  The patient's specimen showed DNA consistent with two copies of chromosomes 21, 18 and 13.  The sensitivity for trisomy 21, trisomy 18 and trisomy 13 using this testing are reported as 99.1%, 99.9% and 91.7% respectively.  Thus, while the results of this testing are highly accurate, they are not considered diagnostic at this time.  Should more definitive information be desired, the patient may still consider amniocentesis.   As requested to know by the patient, sex chromosome analysis was included for this sample.  Results are consistent with a female fetus. This is predicted with >99% accuracy.  A maternal serum AFP only should be considered if screening for neural tube defects is desired.  We may be reached at [phone] with any questions or concerns.   Darrien Laakso F. Nicky Kras, MS, CGC   

## 2019-11-19 ENCOUNTER — Ambulatory Visit: Payer: Self-pay

## 2019-11-20 ENCOUNTER — Ambulatory Visit: Payer: Medicaid Other | Admitting: Physician Assistant

## 2019-11-20 ENCOUNTER — Other Ambulatory Visit: Payer: Self-pay

## 2019-11-20 VITALS — BP 107/67 | HR 77 | Temp 98.3°F | Wt 198.2 lb

## 2019-11-20 DIAGNOSIS — O09521 Supervision of elderly multigravida, first trimester: Secondary | ICD-10-CM

## 2019-11-20 DIAGNOSIS — O99210 Obesity complicating pregnancy, unspecified trimester: Secondary | ICD-10-CM

## 2019-11-20 DIAGNOSIS — O2341 Unspecified infection of urinary tract in pregnancy, first trimester: Secondary | ICD-10-CM

## 2019-11-20 DIAGNOSIS — O99212 Obesity complicating pregnancy, second trimester: Secondary | ICD-10-CM

## 2019-11-20 DIAGNOSIS — O09522 Supervision of elderly multigravida, second trimester: Secondary | ICD-10-CM | POA: Diagnosis not present

## 2019-11-20 DIAGNOSIS — O2342 Unspecified infection of urinary tract in pregnancy, second trimester: Secondary | ICD-10-CM

## 2019-11-20 NOTE — Progress Notes (Signed)
   PRENATAL VISIT NOTE  Subjective:  Lindsey Whitney is a 39 y.o. [id number] at [redacted]w[redacted]d being seen today for ongoing prenatal care.  She is currently monitored for the following issues for this high-risk pregnancy and has Supervision of high risk pregnancy in first trimester; Supervision of elderly multigravida in first trimester 39 yo; History of vaginal birth after cesarean--desires TOLAC; Obesity affecting pregnancy BMI=31.6; and UTI (urinary tract infection) during pregnancy GBS 10/22/19 on their problem list.  Patient reports fatigue.  Contractions: Not present. Vag. Bleeding: None.  Movement: Absent. Denies leaking of fluid/ROM.   The following portions of the patient's history were reviewed and updated as appropriate: allergies, current medications, past family history, past medical history, past social history, past surgical history and problem list. Problem list updated.  Objective:   Vitals:   11/20/19 1044  BP: 107/67  Pulse: 77  Temp: 98.3 F (36.8 C)  Weight: 198 lb 3.2 oz (89.9 kg)    Fetal Status: Fetal Heart Rate (bpm): 157 Fundal Height: 18 cm Movement: Absent     General:  Alert, oriented and cooperative. Patient is in no acute distress.  Skin: Skin is warm and dry. No rash noted.   Cardiovascular: Normal heart rate noted  Respiratory: Normal respiratory effort, no problems with respiration noted  Abdomen: Soft, gravid, appropriate for gestational age.  Pain/Pressure: Absent     Pelvic: Cervical exam deferred        Extremities: Normal range of motion.  Edema: None  Mental Status: Normal mood and affect. Normal behavior. Normal judgment and thought content.   Assessment and Plan:  Pregnancy: [id number] at [redacted]w[redacted]d  1. Supervision of elderly multigravida in first trimester 39 yo Doing well, recommend COVID-19 vaccine (pt considering). - AFP Only [facility]- Scanned result  2. Urinary tract infection in mother during first trimester of pregnancy Completed antibiotic course,  TOC today. - Urine Culture  3. Obesity affecting pregnancy, antepartum Declined aspirin, 11 lb wt gain thus far (more than desired), praised walking, enc to continue and f/u with WIC/Nutrition (has had visits.) Push po hydration.   Preterm labor symptoms and general obstetric precautions including but not limited to vaginal bleeding, contractions, leaking of fluid and fetal movement were reviewed in detail with the patient. Please refer to After Visit Summary for other counseling recommendations.  Return in about 4 weeks (around 12/18/2019) for Routine prenatal care.  Future Appointments  Date Time Provider Department Center  12/01/2019  9:00 AM [facility]  12/01/2019 10:00 AM [facility] CONSULT RM [facility] None    Landry Dyke, PA-C

## 2019-11-20 NOTE — Progress Notes (Signed)
In for MH RV at 17.6 weeks. Taking PNV daily. Denies ED visit since last RV. Completed abx course, providing urine TOC today for 7/27 UTI diagnosis.  AFP screen today.  COVID-19 vaccine information given.  Sharlyne Pacas, RN

## 2019-11-22 LAB — URINE CULTURE

## 2019-11-27 ENCOUNTER — Other Ambulatory Visit: Payer: Self-pay | Admitting: Advanced Practice Midwife

## 2019-11-27 DIAGNOSIS — O09522 Supervision of elderly multigravida, second trimester: Secondary | ICD-10-CM

## 2019-12-01 ENCOUNTER — Other Ambulatory Visit: Payer: Self-pay

## 2019-12-01 ENCOUNTER — Ambulatory Visit: Payer: Self-pay | Attending: Obstetrics and Gynecology

## 2019-12-01 ENCOUNTER — Ambulatory Visit: Payer: Self-pay

## 2019-12-01 DIAGNOSIS — O09522 Supervision of elderly multigravida, second trimester: Secondary | ICD-10-CM | POA: Insufficient documentation

## 2019-12-01 DIAGNOSIS — O99212 Obesity complicating pregnancy, second trimester: Secondary | ICD-10-CM | POA: Insufficient documentation

## 2019-12-01 DIAGNOSIS — Z3A19 19 weeks gestation of pregnancy: Secondary | ICD-10-CM | POA: Insufficient documentation

## 2019-12-01 DIAGNOSIS — O34219 Maternal care for unspecified type scar from previous cesarean delivery: Secondary | ICD-10-CM | POA: Insufficient documentation

## 2019-12-18 ENCOUNTER — Ambulatory Visit: Payer: Self-pay

## 2019-12-23 ENCOUNTER — Ambulatory Visit: Payer: Self-pay | Admitting: Advanced Practice Midwife

## 2019-12-23 ENCOUNTER — Other Ambulatory Visit: Payer: Self-pay

## 2019-12-23 VITALS — BP 112/66 | HR 83 | Wt 201.0 lb

## 2019-12-23 DIAGNOSIS — O0991 Supervision of high risk pregnancy, unspecified, first trimester: Secondary | ICD-10-CM

## 2019-12-23 DIAGNOSIS — O99210 Obesity complicating pregnancy, unspecified trimester: Secondary | ICD-10-CM

## 2019-12-23 DIAGNOSIS — O09522 Supervision of elderly multigravida, second trimester: Secondary | ICD-10-CM

## 2019-12-23 DIAGNOSIS — Z98891 History of uterine scar from previous surgery: Secondary | ICD-10-CM

## 2019-12-23 DIAGNOSIS — O09521 Supervision of elderly multigravida, first trimester: Secondary | ICD-10-CM

## 2019-12-23 DIAGNOSIS — O99212 Obesity complicating pregnancy, second trimester: Secondary | ICD-10-CM

## 2019-12-23 DIAGNOSIS — O0992 Supervision of high risk pregnancy, unspecified, second trimester: Secondary | ICD-10-CM

## 2019-12-23 NOTE — Progress Notes (Signed)
   PRENATAL VISIT NOTE  Subjective:  Lindsey Whitney is a 39 y.o. [id number] at [redacted]w[redacted]d being seen today for ongoing prenatal care.  She is currently monitored for the following issues for this high-risk pregnancy and has Supervision of high risk pregnancy in first trimester; Supervision of elderly multigravida in first trimester 39 yo; History of vaginal birth after cesarean--desires TOLAC; Obesity affecting pregnancy BMI=31.6; and UTI (urinary tract infection) during pregnancy GBS 10/22/19 on their problem list.  Patient reports poor appetitie.  Contractions: Not present. Vag. Bleeding: None.  Movement: Present. Denies leaking of fluid/ROM.   The following portions of the patient's history were reviewed and updated as appropriate: allergies, current medications, past family history, past medical history, past social history, past surgical history and problem list. Problem list updated.  Objective:  There were no vitals filed for this visit.  Fetal Status: Fetal Heart Rate (bpm): 160 Fundal Height: 24 cm Movement: Present     General:  Alert, oriented and cooperative. Patient is in no acute distress.  Skin: Skin is warm and dry. No rash noted.   Cardiovascular: Normal heart rate noted  Respiratory: Normal respiratory effort, no problems with respiration noted  Abdomen: Soft, gravid, appropriate for gestational age.  Pain/Pressure: Absent     Pelvic: Cervical exam deferred        Extremities: Normal range of motion.  Edema: None  Mental Status: Normal mood and affect. Normal behavior. Normal judgment and thought content.   Assessment and Plan:  Pregnancy: G5P3013 at [redacted]w[redacted]d  1. Supervision of high risk pregnancy in first trimester C/o poor appetite and only eats 2 small meals/day x 1 week.  Breakfast this am: 1 banana, dinner last night: chicken soup, apple juice,  Denies crying, denies being moody, irritable, sleep wnl. Counseled to eat small protein snacks q 2 hours and total weight gain of  11-20 lbs this pregnancy.   Walks 3x/wk x 10 min.  Not working Needs 32 wk growth u/s  2. Supervision of elderly multigravida in first trimester 39 yo FIRST screen on 10/22/19 at 13 5/7=neg.  AFP only on 11/20/19=neg 11/20/19 C&S TOC=neg  3. History of vaginal birth after cesarean--desires TOLAC Needs [facility] delivery plans appt  4. Obesity affecting pregnancy, antepartum 13 lb 8 oz (6.124 kg) Declines ASA daily   Preterm labor symptoms and general obstetric precautions including but not limited to vaginal bleeding, contractions, leaking of fluid and fetal movement were reviewed in detail with the patient. Please refer to After Visit Summary for other counseling recommendations.  No follow-ups on file.  No future appointments.  Alberteen Spindle, CNM

## 2020-01-20 ENCOUNTER — Ambulatory Visit: Payer: Self-pay

## 2020-01-21 ENCOUNTER — Ambulatory Visit: Payer: Self-pay | Admitting: Advanced Practice Midwife

## 2020-01-21 ENCOUNTER — Other Ambulatory Visit: Payer: Self-pay

## 2020-01-21 VITALS — BP 126/73 | HR 90 | Temp 98.3°F | Wt 207.1 lb

## 2020-01-21 DIAGNOSIS — O99210 Obesity complicating pregnancy, unspecified trimester: Secondary | ICD-10-CM

## 2020-01-21 DIAGNOSIS — O0991 Supervision of high risk pregnancy, unspecified, first trimester: Secondary | ICD-10-CM

## 2020-01-21 DIAGNOSIS — Z98891 History of uterine scar from previous surgery: Secondary | ICD-10-CM

## 2020-01-21 LAB — URINALYSIS
Bilirubin, UA: NEGATIVE
Glucose, UA: NEGATIVE
Ketones, UA: NEGATIVE
Nitrite, UA: NEGATIVE
Protein,UA: NEGATIVE
Specific Gravity, UA: 1.025 (ref 1.005–1.030)
Urobilinogen, Ur: 0.2 mg/dL (ref 0.2–1.0)
pH, UA: 7 (ref 5.0–7.5)

## 2020-01-21 NOTE — Progress Notes (Signed)
Presents to MH RV at 26.5 weeks. Takes PNV daily, denies ED/Hospital visit since last RV. Flu shot given today, tolerated well. Instructed to schedule apt for 2 weeks, for 28 wk lab appointment. Sharlyne Pacas, RN

## 2020-01-21 NOTE — Progress Notes (Signed)
   PRENATAL VISIT NOTE  Subjective:  Lindsey Whitney is a 39 y.o. G9F6213 at [redacted]w[redacted]d being seen today for ongoing prenatal care.  She is currently monitored for the following issues for this high-risk pregnancy and has Supervision of high risk pregnancy in first trimester; Supervision of elderly multigravida in first trimester 39 yo; History of vaginal birth after cesarean--desires TOLAC; Obesity affecting pregnancy BMI=31.6; and UTI (urinary tract infection) during pregnancy GBS 10/22/19 on their problem list.  Patient reports no complaints.  Contractions: Not present. Vag. Bleeding: None.  Movement: Present. Denies leaking of fluid/ROM.   The following portions of the patient's history were reviewed and updated as appropriate: allergies, current medications, past family history, past medical history, past social history, past surgical history and problem list. Problem list updated.  Objective:   Vitals:   01/21/20 1020  BP: 126/73  Pulse: 90  Temp: 98.3 F (36.8 C)  Weight: 207 lb 1.6 oz (93.9 kg)    Fetal Status: Fetal Heart Rate (bpm): 160 Fundal Height: 30 cm Movement: Present     General:  Alert, oriented and cooperative. Patient is in no acute distress.  Skin: Skin is warm and dry. No rash noted.   Cardiovascular: Normal heart rate noted  Respiratory: Normal respiratory effort, no problems with respiration noted  Abdomen: Soft, gravid, appropriate for gestational age.  Pain/Pressure: Absent     Pelvic: Cervical exam deferred        Extremities: Normal range of motion.  Edema: None  Mental Status: Normal mood and affect. Normal behavior. Normal judgment and thought content.   Assessment and Plan:  Pregnancy: [id number] at [redacted]w[redacted]d  1. Supervision of high risk pregnancy in first trimester Not working.  Feels tired.  12 yo daughter with runny nose, cough, sore throat--encouraged covid testing today for daughter S>D and u/s ordered - Urinalysis (Urine Dip) - Urinalysis (Urine  Dip)  2. Obesity affecting pregnancy, antepartum Not taking ASA 81 mg 20 lb 1.6 oz (9.117 kg) Walking 4x/wk now x 20 min Needs 32 wk growth u/s 6 lb wt gain in last 4 wks with sl rising BP--u/a ordered  3. History of vaginal birth after cesarean--desires TOLAC    Preterm labor symptoms and general obstetric precautions including but not limited to vaginal bleeding, contractions, leaking of fluid and fetal movement were reviewed in detail with the patient. Please refer to After Visit Summary for other counseling recommendations.  Return in about 2 weeks (around 02/04/2020) for routine PNC.  No future appointments.  Alberteen Spindle, CNM

## 2020-01-22 ENCOUNTER — Telehealth: Payer: Self-pay

## 2020-01-22 NOTE — Telephone Encounter (Signed)
Per [facility] OB US scheduler, she spoke with radiology supervisor regarding order for follow-up US due to S > D. As client had first trimester screening and anatomy US at [facility] MFM and no [place] this pregnancy at [facility], [facility] will not do a follow-up US. They will only do a complete OB US > 14 weeks as have not previously scanned her this pregnancy. Per radiology, they are cancelling order for [facility] [place] pending clarification of facility for [place]. Consult with A. Streilein PA-C and she ordered S > D [place] at [facility] MFM. Referral faxed with fax confirmation received. Jossie Ng, RN

## 2020-01-22 NOTE — Telephone Encounter (Signed)
Call to scheduler to ascertain if [place] for  S > D (faxed yesterday with confirmation received) has been scheduled. Per scheduler, no fax received for client. Referral re-faxed to 2 different numbers and fax confirmation received for both. Jossie Ng, RN

## 2020-01-23 ENCOUNTER — Other Ambulatory Visit: Payer: Self-pay | Admitting: Physician Assistant

## 2020-01-26 ENCOUNTER — Telehealth: Payer: Self-pay

## 2020-01-26 ENCOUNTER — Telehealth: Payer: Self-pay | Admitting: Family Medicine

## 2020-01-26 NOTE — Telephone Encounter (Signed)
Please refer to other phone encounter dated 01/26/2020. Jossie Ng, RN

## 2020-01-26 NOTE — Telephone Encounter (Signed)
RETURNING LYNETTE'S CALL.

## 2020-01-26 NOTE — Telephone Encounter (Signed)
Client notified of 02/02/2020 [place] appt at 9:00 am (via phone call). Jossie Ng, RN

## 2020-01-26 NOTE — Telephone Encounter (Signed)
Call to [facility] MFM to ascertain if [place] appt for S > D scheduled. Per Steward Drone (receptionist), fax not received. Referral with demographic info faxed and confirmation received. Shortly afterwards, received call from [place] who stated Toni Amend had [place] original [place] referral on her desk. Appt scheduled for 02/02/2020 at 0900. Call to client's cell and left message to call regarding [place] appt and number to call provided. No answer or voicemail at home number. Left message to all on voicemail of emergency contact with number to call provided. Jossie Ng, RN

## 2020-01-29 ENCOUNTER — Other Ambulatory Visit: Payer: Self-pay | Admitting: Advanced Practice Midwife

## 2020-01-29 DIAGNOSIS — O09529 Supervision of elderly multigravida, unspecified trimester: Secondary | ICD-10-CM

## 2020-02-02 ENCOUNTER — Other Ambulatory Visit: Payer: Self-pay

## 2020-02-02 ENCOUNTER — Ambulatory Visit: Payer: Self-pay | Attending: Obstetrics

## 2020-02-02 DIAGNOSIS — O99213 Obesity complicating pregnancy, third trimester: Secondary | ICD-10-CM | POA: Insufficient documentation

## 2020-02-02 DIAGNOSIS — Z3A28 28 weeks gestation of pregnancy: Secondary | ICD-10-CM | POA: Insufficient documentation

## 2020-02-02 DIAGNOSIS — O09523 Supervision of elderly multigravida, third trimester: Secondary | ICD-10-CM | POA: Insufficient documentation

## 2020-02-02 DIAGNOSIS — O34219 Maternal care for unspecified type scar from previous cesarean delivery: Secondary | ICD-10-CM

## 2020-02-02 DIAGNOSIS — O09529 Supervision of elderly multigravida, unspecified trimester: Secondary | ICD-10-CM

## 2020-02-02 DIAGNOSIS — O99212 Obesity complicating pregnancy, second trimester: Secondary | ICD-10-CM

## 2020-02-03 ENCOUNTER — Encounter: Payer: Self-pay | Admitting: Advanced Practice Midwife

## 2020-02-04 ENCOUNTER — Ambulatory Visit: Payer: Self-pay | Admitting: Advanced Practice Midwife

## 2020-02-04 ENCOUNTER — Other Ambulatory Visit: Payer: Self-pay

## 2020-02-04 VITALS — BP 119/88 | HR 88 | Wt 209.0 lb

## 2020-02-04 DIAGNOSIS — O0991 Supervision of high risk pregnancy, unspecified, first trimester: Secondary | ICD-10-CM

## 2020-02-04 DIAGNOSIS — O09521 Supervision of elderly multigravida, first trimester: Secondary | ICD-10-CM

## 2020-02-04 DIAGNOSIS — O99013 Anemia complicating pregnancy, third trimester: Secondary | ICD-10-CM

## 2020-02-04 DIAGNOSIS — O99019 Anemia complicating pregnancy, unspecified trimester: Secondary | ICD-10-CM | POA: Insufficient documentation

## 2020-02-04 DIAGNOSIS — O09523 Supervision of elderly multigravida, third trimester: Secondary | ICD-10-CM

## 2020-02-04 LAB — HEMOGLOBIN, FINGERSTICK: Hemoglobin: 10.8 g/dL — ABNORMAL LOW (ref 11.1–15.9)

## 2020-02-04 MED ORDER — FERROUS SULFATE 324 (65 FE) MG PO TBEC: 1.0000 | DELAYED_RELEASE_TABLET | Freq: Every day | ORAL | 0 refills | Status: DC

## 2020-02-04 NOTE — Progress Notes (Addendum)
Presents for MH RV at 28.5 weeks. Takes PNV daily. 28 week labs collected today. 1hr gtt today, tolerated well. Results reviewed with provider, hgb =10.8. Pt counseled to take iron once daily with orange juice or vitamin C drink. 100 tablets of iron dispensed. Pt verbalized understanding.Anemia in pregnancy added to problem list. 2 week apt scheduled by this RN. TDAP given.  Sharlyne Pacas, RN

## 2020-02-04 NOTE — Progress Notes (Signed)
   PRENATAL VISIT NOTE  Subjective:  Lindsey Whitney is a 39 y.o. W4R1540 at [redacted]w[redacted]d being seen today for ongoing prenatal care.  She is currently monitored for the following issues for this low-risk pregnancy and has Supervision of high risk pregnancy in first trimester; Supervision of elderly multigravida in first trimester 39 yo; History of vaginal birth after cesarean--desires TOLAC; Obesity affecting pregnancy BMI=31.6; and UTI (urinary tract infection) during pregnancy GBS 10/22/19 on their problem list.  Patient reports no complaints.  Contractions: Not present. Vag. Bleeding: None.  Movement: Present. Denies leaking of fluid/ROM.   The following portions of the patient's history were reviewed and updated as appropriate: allergies, current medications, past family history, past medical history, past social history, past surgical history and problem list. Problem list updated.  Objective:   Vitals:   02/04/20 1027  BP: 129/69  Pulse: 86  Weight: 209 lb (94.8 kg)    Fetal Status: Fetal Heart Rate (bpm): 150 Fundal Height: 29 cm Movement: Present     General:  Alert, oriented and cooperative. Patient is in no acute distress.  Skin: Skin is warm and dry. No rash noted.   Cardiovascular: Normal heart rate noted  Respiratory: Normal respiratory effort, no problems with respiration noted  Abdomen: Soft, gravid, appropriate for gestational age.  Pain/Pressure: Absent     Pelvic: Cervical exam deferred        Extremities: Normal range of motion.  Edema: None  Mental Status: Normal mood and affect. Normal behavior. Normal judgment and thought content.   Assessment and Plan:  Pregnancy: G8Q7619 at 110w5d  1. Supervision of high risk pregnancy in first trimester 1 hour glucola today BP 129/69--please re check BP today Not working.   Needs 32 wk growth u/s Reviewed 02/02/20 u/swith EFW=69%, fundal placenta, s=d - HIV Antibody (routine testing w rflx) - RPR - Glucose, 1 hour  gestational  2. Supervision of elderly multigravida in first trimester   3. High risk pregnancy, multigravida of advanced maternal age in third trimester  - Hemoglobin, fingerstick   Preterm labor symptoms and general obstetric precautions including but not limited to vaginal bleeding, contractions, leaking of fluid and fetal movement were reviewed in detail with the patient. Please refer to After Visit Summary for other counseling recommendations.  No follow-ups on file.  Future Appointments  Date Time Provider Department Center  03/29/2020  9:00 AM [facility]  03/29/2020 10:00 AM [facility] CONSULT RM [facility] None    Alberteen Spindle, CNM

## 2020-02-05 LAB — FE+CBC/D/PLT+TIBC+FER+RETIC
Basophils Absolute: 0 10*3/uL (ref 0.0–0.2)
Basos: 1 %
EOS (ABSOLUTE): 0.1 10*3/uL (ref 0.0–0.4)
Eos: 1 %
Ferritin: 34 ng/mL (ref 15–150)
Hematocrit: 33.8 % — ABNORMAL LOW (ref 34.0–46.6)
Hemoglobin: 10.9 g/dL — ABNORMAL LOW (ref 11.1–15.9)
Immature Grans (Abs): 0 10*3/uL (ref 0.0–0.1)
Immature Granulocytes: 1 %
Iron Saturation: 18 % (ref 15–55)
Iron: 79 ug/dL (ref 27–159)
Lymphocytes Absolute: 1.8 10*3/uL (ref 0.7–3.1)
Lymphs: 24 %
MCH: 30.2 pg (ref 26.6–33.0)
MCHC: 32.2 g/dL (ref 31.5–35.7)
MCV: 94 fL (ref 79–97)
Monocytes Absolute: 0.6 10*3/uL (ref 0.1–0.9)
Monocytes: 8 %
Neutrophils Absolute: 4.9 10*3/uL (ref 1.4–7.0)
Neutrophils: 65 %
Platelets: 195 10*3/uL (ref 150–450)
RBC: 3.61 x10E6/uL — ABNORMAL LOW (ref 3.77–5.28)
RDW: 12.6 % (ref 11.7–15.4)
Retic Ct Pct: 2.2 % (ref 0.6–2.6)
Total Iron Binding Capacity: 433 ug/dL (ref 250–450)
UIBC: 354 ug/dL (ref 131–425)
WBC: 7.4 10*3/uL (ref 3.4–10.8)

## 2020-02-05 LAB — RPR: RPR Ser Ql: NONREACTIVE

## 2020-02-05 LAB — GLUCOSE, 1 HOUR GESTATIONAL: Gestational Diabetes Screen: 88 mg/dL (ref 65–139)

## 2020-02-05 LAB — HIV ANTIBODY (ROUTINE TESTING W REFLEX): HIV Screen 4th Generation wRfx: NONREACTIVE

## 2020-02-18 ENCOUNTER — Other Ambulatory Visit: Payer: Self-pay

## 2020-02-18 ENCOUNTER — Ambulatory Visit: Payer: Self-pay | Admitting: Advanced Practice Midwife

## 2020-02-18 VITALS — BP 115/69 | HR 87 | Temp 99.4°F | Wt 209.0 lb

## 2020-02-18 DIAGNOSIS — O09521 Supervision of elderly multigravida, first trimester: Secondary | ICD-10-CM

## 2020-02-18 DIAGNOSIS — O0991 Supervision of high risk pregnancy, unspecified, first trimester: Secondary | ICD-10-CM

## 2020-02-18 DIAGNOSIS — Z98891 History of uterine scar from previous surgery: Secondary | ICD-10-CM

## 2020-02-18 DIAGNOSIS — O99013 Anemia complicating pregnancy, third trimester: Secondary | ICD-10-CM

## 2020-02-18 DIAGNOSIS — O99210 Obesity complicating pregnancy, unspecified trimester: Secondary | ICD-10-CM

## 2020-02-18 NOTE — Progress Notes (Signed)
   PRENATAL VISIT NOTE  Subjective:  Lindsey Whitney is a 39 y.o. K3T4656 at [redacted]w[redacted]d being seen today for ongoing prenatal care.  She is currently monitored for the following issues for this high-risk pregnancy and has Supervision of high risk pregnancy in first trimester; Supervision of elderly multigravida in first trimester 39 yo; History of vaginal birth after cesarean--desires TOLAC; Obesity affecting pregnancy BMI=31.6; UTI (urinary tract infection) during pregnancy GBS 10/22/19; and Anemia in pregnancy on their problem list.  Patient reports no complaints.  Contractions: Not present. Vag. Bleeding: None.  Movement: Present. Denies leaking of fluid/ROM.   The following portions of the patient's history were reviewed and updated as appropriate: allergies, current medications, past family history, past medical history, past social history, past surgical history and problem list. Problem list updated.  Objective:   Vitals:   02/18/20 1021  BP: 115/69  Pulse: 87  Temp: 99.4 F (37.4 C)  Weight: 209 lb (94.8 kg)    Fetal Status: Fetal Heart Rate (bpm): 160 Fundal Height: 35 cm Movement: Present  Presentation: Complete Breech  General:  Alert, oriented and cooperative. Patient is in no acute distress.  Skin: Skin is warm and dry. No rash noted.   Cardiovascular: Normal heart rate noted  Respiratory: Normal respiratory effort, no problems with respiration noted  Abdomen: Soft, gravid, appropriate for gestational age.  Pain/Pressure: Absent     Pelvic: Cervical exam deferred        Extremities: Normal range of motion.  Edema: None  Mental Status: Normal mood and affect. Normal behavior. Normal judgment and thought content.   Assessment and Plan:  Pregnancy: C1E7517 at [redacted]w[redacted]d  1. Anemia during pregnancy in third trimester Taking I FeSo4 daily with oj   2. Supervision of high risk pregnancy in first trimester   3. Obesity affecting pregnancy, antepartum Not taking ASA Drinking  3 c. Fanta/day--to decrease to 1 daily  4. Supervision of elderly multigravida in first trimester 39 yo Reviewed 02/02/20 u/s with EFW=69%, AFI wnl, fetal growth wnl, fundal placenta at 29.0 wks Has f/u growth u/s 03/29/20 S>D today Walking 5x/wk x 30 min.  Not working.  Her church is hellping her with baby supplies.  Has car seat   5. History of vaginal birth after cesarean--desires TOLAC Needs delivery plans app[facility] at [facility]    Preterm labor symptoms and general obstetric precautions including but not limited to vaginal bleeding, contractions, leaking of fluid and fetal movement were reviewed in detail with the patient. Please refer to After Visit Summary for other counseling recommendations.  Return in about 2 weeks (around 03/03/2020) for routine PNC.  Future Appointments  Date Time Provider Department Center  03/29/2020  9:00 AM [facility]  03/29/2020 10:00 AM [facility] CONSULT RM [facility] None    Alberteen Spindle, CNM

## 2020-03-03 ENCOUNTER — Other Ambulatory Visit: Payer: Self-pay

## 2020-03-03 ENCOUNTER — Encounter: Payer: Self-pay | Admitting: Advanced Practice Midwife

## 2020-03-03 ENCOUNTER — Ambulatory Visit: Payer: Self-pay | Admitting: Advanced Practice Midwife

## 2020-03-03 VITALS — BP 125/71 | HR 91 | Temp 91.0°F | Wt 210.0 lb

## 2020-03-03 DIAGNOSIS — O0991 Supervision of high risk pregnancy, unspecified, first trimester: Secondary | ICD-10-CM

## 2020-03-03 DIAGNOSIS — O99210 Obesity complicating pregnancy, unspecified trimester: Secondary | ICD-10-CM

## 2020-03-03 DIAGNOSIS — O99013 Anemia complicating pregnancy, third trimester: Secondary | ICD-10-CM

## 2020-03-03 DIAGNOSIS — Z98891 History of uterine scar from previous surgery: Secondary | ICD-10-CM

## 2020-03-03 DIAGNOSIS — O09521 Supervision of elderly multigravida, first trimester: Secondary | ICD-10-CM

## 2020-03-03 LAB — HEMOGLOBIN, FINGERSTICK: Hemoglobin: 11.3 g/dL (ref 11.1–15.9)

## 2020-03-03 NOTE — Progress Notes (Signed)
   PRENATAL VISIT NOTE  Subjective:  Lindsey Whitney is a 39 y.o. T0W4097 at [redacted]w[redacted]d being seen today for ongoing prenatal care.  She is currently monitored for the following issues for this high-risk pregnancy and has Supervision of high risk pregnancy in first trimester; Supervision of elderly multigravida in first trimester 39 yo; History of vaginal birth after cesarean--desires TOLAC; Obesity affecting pregnancy BMI=31.6; UTI (urinary tract infection) during pregnancy GBS 10/22/19; and Anemia in pregnancy on their problem list.  Patient reports no complaints.   .  .   . Denies leaking of fluid/ROM.   The following portions of the patient's history were reviewed and updated as appropriate: allergies, current medications, past family history, past medical history, past social history, past surgical history and problem list. Problem list updated.  Objective:   Vitals:   03/03/20 0957  BP: 125/71  Pulse: 91  Temp: (!) 91 F (32.8 C)  Weight: 210 lb (95.3 kg)    Fetal Status:           General:  Alert, oriented and cooperative. Patient is in no acute distress.  Skin: Skin is warm and dry. No rash noted.   Cardiovascular: Normal heart rate noted  Respiratory: Normal respiratory effort, no problems with respiration noted  Abdomen: Soft, gravid, appropriate for gestational age.        Pelvic: Cervical exam deferred        Extremities: Normal range of motion.     Mental Status: Normal mood and affect. Normal behavior. Normal judgment and thought content.   Assessment and Plan:  Pregnancy: D5H2992 at [redacted]w[redacted]d  1. Anemia during pregnancy in third trimester Taking I FeSo4 daily with oj Hgb today - Hemoglobin, fingerstick   2. Obesity affecting pregnancy, antepartum 23 lb (10.4 kg) Not taking ASA 81 mg daily  3. Supervision of high risk pregnancy in first trimester Feels well.  Walking 5x/wk x 30 min Drinking 2c. Fanta/day now (down from 3 c./day)   4. Supervision of elderly  multigravida in first trimester 39 yo   5. History of vaginal birth after cesarean--desires TOLAC TOLAC referral written for delivery plans apt Need birth weight of 09/20/11 newborn for [facility] delivery plans apt   Preterm labor symptoms and general obstetric precautions including but not limited to vaginal bleeding, contractions, leaking of fluid and fetal movement were reviewed in detail with the patient. Please refer to After Visit Summary for other counseling recommendations.  No follow-ups on file.  Future Appointments  Date Time Provider Department Center  03/29/2020  9:00 AM [facility]    Alberteen Spindle, CNM

## 2020-03-03 NOTE — Progress Notes (Signed)
Presents for MH RV at 32.5 weeks. Takes PNV and iron daily. Denies ED/Hospital visit since last RV. Hgb is 11.3, per standing order, patient to continue iron for 2 months. Sharlyne Pacas, RN

## 2020-03-04 LAB — HGB A1C W/O EAG: Hgb A1c MFr Bld: 5.6 % (ref 4.8–5.6)

## 2020-03-09 NOTE — Progress Notes (Signed)
Per Epic, has [facility] delivery planning appt (desires repeat TOLAC) 03/10/2020 at 1030 with Dr. Jean Rosenthal. Jossie Ng, RN

## 2020-03-10 ENCOUNTER — Encounter: Payer: MEDICAID | Admitting: Obstetrics and Gynecology

## 2020-03-15 ENCOUNTER — Ambulatory Visit (INDEPENDENT_AMBULATORY_CARE_PROVIDER_SITE_OTHER): Payer: Self-pay | Admitting: Obstetrics and Gynecology

## 2020-03-15 ENCOUNTER — Other Ambulatory Visit: Payer: Self-pay

## 2020-03-15 VITALS — BP 126/78 | Wt 213.0 lb

## 2020-03-15 DIAGNOSIS — Z98891 History of uterine scar from previous surgery: Secondary | ICD-10-CM

## 2020-03-15 DIAGNOSIS — O99013 Anemia complicating pregnancy, third trimester: Secondary | ICD-10-CM

## 2020-03-15 DIAGNOSIS — O34219 Maternal care for unspecified type scar from previous cesarean delivery: Secondary | ICD-10-CM

## 2020-03-15 DIAGNOSIS — O0993 Supervision of high risk pregnancy, unspecified, third trimester: Secondary | ICD-10-CM

## 2020-03-15 DIAGNOSIS — O09523 Supervision of elderly multigravida, third trimester: Secondary | ICD-10-CM

## 2020-03-15 DIAGNOSIS — Z3A34 34 weeks gestation of pregnancy: Secondary | ICD-10-CM

## 2020-03-15 NOTE — Progress Notes (Signed)
Obstetrics & Gynecology Office Visit   Chief Complaint: Delivery plans from [facility]  History of Present Illness: 39 y.o. [phone] female who presents for delivery planning. She has been receiving prenatal care from [facility].  This is her 5th pregnancy.  G1 in 2009 had a c-section for breech. She has had two subsequent successful VBACs in 2013 and in 2020.  She presents in referral from [facility] for delivery planning.   Her pregnancy has been complicated by a history of anemia and her history of c-sectinos.  She also had a GBS UTI in 10/2019.  She has been evaluated by [facility] for growth as she is measuring size > dates.  The EFW on 02/02/20 was 69th %ile.   She would like to attempt a vaginal delivery. Today she notes +FM, no LOF, no vaginal bleeding, and no contractions.   Past Medical History:  Diagnosis Date  . Anemia    2020 pregnancy  . Medical history non-contributory   . No known health problems   . Pregnancy induced hypertension    2020 pregnancy    Past Surgical History:  Procedure Laterality Date  . CESAREAN SECTION     2009    Gynecologic History: Patient's last menstrual period was 07/27/2019 (exact date).  Obstetric History: P7T0626  Family History  Problem Relation Age of Onset  . Fibroids Sister     Social History   Socioeconomic History  . Marital status: Married    Spouse name: Minerva Areola   . Number of children: 3  . Years of education: 34  . Highest education level: Not on file  Occupational History  . Not on file  Tobacco Use  . Smoking status: Never Smoker  . Smokeless tobacco: Never Used  Vaping Use  . Vaping Use: Never used  Substance and Sexual Activity  . Alcohol use: Never  . Drug use: Never  . Sexual activity: Yes    Birth control/protection: None, Implant    Comment: Removed 2019  Other Topics Concern  . Not on file  Social History Narrative  . Not on file   Social Determinants of Health   Financial Resource Strain: Not on file  Food  Insecurity: No Food Insecurity  . Worried About Programme researcher, broadcasting/film/video in the Last Year: Never true  . Ran Out of Food in the Last Year: Never true  Transportation Needs: No Transportation Needs  . Lack of Transportation (Medical): No  . Lack of Transportation (Non-Medical): No  Physical Activity: Not on file  Stress: Not on file  Social Connections: Not on file  Intimate Partner Violence: Not At Risk  . Fear of Current or Ex-Partner: No  . Emotionally Abused: No  . Physically Abused: No  . Sexually Abused: No    No Known Allergies  Prior to Admission medications   Medication Sig Start Date End Date Taking? Authorizing Provider  ferrous sulfate 324 (65 Fe) MG TBEC Take 1 tablet (325 mg total) by mouth daily. 02/04/20   Sciora, Austin Miles, CNM  Prenatal Vit-Fe Fumarate-FA (MULTIVITAMIN-PRENATAL) 27-0.8 MG TABS tablet Take 1 tablet by mouth daily.     [provider]    Review of Systems  Constitutional: Negative.   HENT: Negative.   Eyes: Negative.   Respiratory: Negative.   Cardiovascular: Negative.   Gastrointestinal: Negative.   Genitourinary: Negative.   Musculoskeletal: Negative.   Skin: Negative.   Neurological: Negative.   Psychiatric/Behavioral: Negative.      Physical Exam BP 126/78  Wt 213 lb (96.6 kg)   LMP 07/27/2019 (Exact Date)   BMI 35.45 kg/m  Patient's last menstrual period was 07/27/2019 (exact date). Physical Exam Constitutional:      General: She is not in acute distress.    Appearance: Normal appearance. She is well-developed.  HENT:     Head: Normocephalic and atraumatic.  Eyes:     General: No scleral icterus.    Conjunctiva/sclera: Conjunctivae normal.  Cardiovascular:     Rate and Rhythm: Normal rate and regular rhythm.     Heart sounds: No murmur heard. No friction rub. No gallop.   Pulmonary:     Effort: Pulmonary effort is normal. No respiratory distress.     Breath sounds: Normal breath sounds. No wheezing or rales.   Abdominal:     General: Bowel sounds are normal. There is no distension.     Palpations: Abdomen is soft. There is mass (gravid, NT).     Tenderness: There is no abdominal tenderness. There is no guarding or rebound.     Comments: FHT: normal range  Musculoskeletal:        General: Normal range of motion.     Cervical back: Normal range of motion and neck supple.  Neurological:     General: No focal deficit present.     Mental Status: She is alert and oriented to person, place, and time.     Cranial Nerves: No cranial nerve deficit.  Skin:    General: Skin is warm and dry.     Findings: No erythema.  Psychiatric:        Mood and Affect: Mood normal.        Behavior: Behavior normal.        Judgment: Judgment normal.     Female chaperone present for pelvic and breast  portions of the physical exam  Assessment: 39 y.o. L2X5170 female here for  1. Supervision of high risk pregnancy in third trimester   2. Anemia during pregnancy in third trimester   3. Advanced maternal age in multigravida, third trimester   4. History of cesarean delivery affecting pregnancy   5. History of vaginal birth after cesarean--desires TOLAC   6. [redacted] weeks gestation of pregnancy      Plan: Problem List Items Addressed This Visit      Other   Supervision of high risk pregnancy in first trimester - Primary   Supervision of elderly multigravida in first trimester 39 yo   History of vaginal birth after cesarean--desires TOLAC   Anemia in pregnancy    Other Visit Diagnoses    History of cesarean delivery affecting pregnancy       [redacted] weeks gestation of pregnancy         39 y.o. Y1V4944 at [redacted]w[redacted]d with Estimated Date of Delivery: 04/23/20 was seen today in office to discuss trial of labor after cesarean section (TOLAC) versus elective repeat cesarean delivery (ERCD). The following risks were discussed with the patient.  Risk of uterine rupture at term is 0.78 percent with TOLAC and 0.22 percent with  ERCD. 1 in 10 uterine ruptures will result in neonatal death or neurological injury. The benefits of a trial of labor after cesarean (TOLAC) resulting in a vaginal birth after cesarean (VBAC) include the following: shorter length of hospital stay and postpartum recovery (in most cases); fewer complications, such as postpartum fever, wound or uterine infection, thromboembolism (blood clots in the leg or lung), need for blood transfusion and fewer neonatal breathing problems. The  risks of an attempted VBAC or TOLAC include the following: Risk of failed trial of labor after cesarean (TOLAC) without a vaginal birth after cesarean (VBAC) resulting in repeat cesarean delivery (RCD) in about 20 to 40 percent of women who attempt VBAC.  SHE IS AN EXCELLENT CANDIDATE FOR TOLAC, GIVEN HER HISTORY OF TWO SUCCESSFUL VBACS. Marland Kitchen   Risk of rupture of uterus resulting in an emergency cesarean delivery. The risk of uterine rupture may be related in part to the type of uterine incision made during the first cesarean delivery. A previous transverse uterine incision has the lowest risk of rupture (0.2 to 1.5 percent risk). Vertical or T-shaped uterine incisions have a higher risk of uterine rupture (4 to 9 percent risk)The risk of fetal death is very low with both VBAC and elective repeat cesarean delivery (ERCD), but the likelihood of fetal death is higher with VBAC than with ERCD. Maternal death is very rare with either type of delivery. The risks of an elective repeat cesarean delivery (ERCD) were reviewed with the patient including but not limited to: 05/998 risk of uterine rupture which could have serious consequences, bleeding which may require transfusion; infection which may require antibiotics; injury to bowel, bladder or other surrounding organs (bowel, bladder, ureters); injury to the fetus; need for additional procedures including hysterectomy in the event of a life-threatening hemorrhage; thromboembolic phenomenon;  abnormal placentation; incisional problems; death and other postoperative or anesthesia complications.    In addition we discussed that our collective office practice is to allow patient's who desire to attempt TOLAC to go into labor naturally.  There is some limited data that rupture rate may increase past [redacted] weeks gestation, but it is reasonable for women who are strongly committed to [facility] to continue pregnancy into the 41st week.  Medical indications necessetating early delivery may arise during the course of any pregnancy.  Given the contraindication on the use of prostaglandins for use in cervical ripening,  recommendation would be to proceed with repeat cesarean for delivery for patient's with unfavorable cervix (low Bishops score) who reach 41 weeks or who otherwise have a medical indication for early delivery.   These risks and benefits are summarized on the consent form, which was reviewed with the patient during the visit.  All her questions answered and she signed a consent indicating a preference for TOLAC/ERCD. A copy of the consent was given to the patient.  As she reaches [redacted] weeks gestation, deliver planning for induction of labor by 41 weeks, barring any new obstetrical issues, should be considered.   Thomasene Mohair, MD 03/15/2020 12:17 PM   CC: [facility]

## 2020-03-16 ENCOUNTER — Encounter: Payer: Self-pay | Admitting: Obstetrics and Gynecology

## 2020-03-17 ENCOUNTER — Other Ambulatory Visit: Payer: Self-pay

## 2020-03-17 ENCOUNTER — Ambulatory Visit: Payer: Self-pay | Admitting: Advanced Practice Midwife

## 2020-03-17 VITALS — BP 119/67 | HR 80 | Temp 98.0°F | Wt 212.0 lb

## 2020-03-17 DIAGNOSIS — O0991 Supervision of high risk pregnancy, unspecified, first trimester: Secondary | ICD-10-CM

## 2020-03-17 DIAGNOSIS — O99210 Obesity complicating pregnancy, unspecified trimester: Secondary | ICD-10-CM

## 2020-03-17 DIAGNOSIS — O09521 Supervision of elderly multigravida, first trimester: Secondary | ICD-10-CM

## 2020-03-17 DIAGNOSIS — O99013 Anemia complicating pregnancy, third trimester: Secondary | ICD-10-CM

## 2020-03-17 DIAGNOSIS — Z98891 History of uterine scar from previous surgery: Secondary | ICD-10-CM

## 2020-03-17 NOTE — Progress Notes (Signed)
Patient here for MH RV at 34 5/7. Patient taking iron once daily with juice.Marland KitchenMarland KitchenBurt Knack, RN

## 2020-03-17 NOTE — Progress Notes (Signed)
   PRENATAL VISIT NOTE  Subjective:  Lindsey Whitney is a 39 y.o. V6H6073 at [redacted]w[redacted]d being seen today for ongoing prenatal care.  She is currently monitored for the following issues for this low-risk pregnancy and has Supervision of high risk pregnancy in first trimester; Supervision of elderly multigravida in first trimester 39 yo; History of vaginal birth after cesarean--desires TOLAC; Obesity affecting pregnancy BMI=31.6; UTI (urinary tract infection) during pregnancy GBS 10/22/19; and Anemia in pregnancy on their problem list.  Patient reports no complaints.  Contractions: Not present. Vag. Bleeding: None.  Movement: Present. Denies leaking of fluid/ROM.   The following portions of the patient's history were reviewed and updated as appropriate: allergies, current medications, past family history, past medical history, past social history, past surgical history and problem list. Problem list updated.  Objective:   Vitals:   03/17/20 1019  BP: 119/67  Pulse: 80  Temp: 98 F (36.7 C)  Weight: 212 lb (96.2 kg)    Fetal Status: Fetal Heart Rate (bpm): 150 Fundal Height: 38 cm Movement: Present  Presentation: Vertex  General:  Alert, oriented and cooperative. Patient is in no acute distress.  Skin: Skin is warm and dry. No rash noted.   Cardiovascular: Normal heart rate noted  Respiratory: Normal respiratory effort, no problems with respiration noted  Abdomen: Soft, gravid, appropriate for gestational age.  Pain/Pressure: Absent     Pelvic: Cervical exam deferred        Extremities: Normal range of motion.  Edema: None  Mental Status: Normal mood and affect. Normal behavior. Normal judgment and thought content.   Assessment and Plan:  Pregnancy: X1G6269 at [redacted]w[redacted]d  1. History of vaginal birth after cesarean--desires TOLAC Had delivery plans appt with Dr. Jean Rosenthal on 03/10/20 and approved for TOLAC  2. Anemia during pregnancy in third trimester Taking FeSo4 I daily with OJ seperately  from vits  3. Supervision of high risk pregnancy in first trimester Feels well. Ready for baby at home; has car seat.  S>D and has u/s 03/29/20. Walking 5x/wk x 20 min.  4. Supervision of elderly multigravida in first trimester 39 yo   5. Obesity affecting pregnancy, antepartum 25 lb (11.3 kg) Not taking ASA this pregnancy   Preterm labor symptoms and general obstetric precautions including but not limited to vaginal bleeding, contractions, leaking of fluid and fetal movement were reviewed in detail with the patient. Please refer to After Visit Summary for other counseling recommendations.  No follow-ups on file.  Future Appointments  Date Time Provider Department Center  03/29/2020  9:00 AM [facility]  03/31/2020 10:00 AM [facility] PROVIDER [facility] None    Alberteen Spindle, CNM

## 2020-03-25 ENCOUNTER — Other Ambulatory Visit: Payer: Self-pay | Admitting: Advanced Practice Midwife

## 2020-03-25 DIAGNOSIS — O99213 Obesity complicating pregnancy, third trimester: Secondary | ICD-10-CM

## 2020-03-25 DIAGNOSIS — Z98891 History of uterine scar from previous surgery: Secondary | ICD-10-CM

## 2020-03-25 DIAGNOSIS — O09523 Supervision of elderly multigravida, third trimester: Secondary | ICD-10-CM

## 2020-03-26 ENCOUNTER — Other Ambulatory Visit: Payer: Self-pay

## 2020-03-26 ENCOUNTER — Emergency Department
Admission: EM | Admit: 2020-03-26 | Discharge: 2020-03-26 | Disposition: A | Discharge status: Home / Self Care | Payer: Self-pay | Attending: Student in an Organized Health Care Education/Training Program | Admitting: Student in an Organized Health Care Education/Training Program

## 2020-03-26 ENCOUNTER — Encounter: Payer: Self-pay | Admitting: Emergency Medicine

## 2020-03-26 DIAGNOSIS — O99613 Diseases of the digestive system complicating pregnancy, third trimester: Secondary | ICD-10-CM | POA: Insufficient documentation

## 2020-03-26 DIAGNOSIS — R197 Diarrhea, unspecified: Secondary | ICD-10-CM | POA: Insufficient documentation

## 2020-03-26 DIAGNOSIS — Z3A35 35 weeks gestation of pregnancy: Secondary | ICD-10-CM | POA: Insufficient documentation

## 2020-03-26 DIAGNOSIS — O2243 Hemorrhoids in pregnancy, third trimester: Secondary | ICD-10-CM | POA: Insufficient documentation

## 2020-03-26 DIAGNOSIS — K641 Second degree hemorrhoids: Secondary | ICD-10-CM

## 2020-03-26 MED ORDER — HYDROCORTISONE ACETATE 25 MG RE SUPP
25.0000 mg | Freq: Once | RECTAL | Status: AC
  Administered 2020-03-26: 17:00:00 25 mg via RECTAL
  Filled 2020-03-26: qty 1

## 2020-03-26 MED ORDER — HYDROCODONE-ACETAMINOPHEN 5-325 MG PO TABS: 1.0000 | ORAL_TABLET | Freq: Three times a day (TID) | ORAL | 0 refills | Status: DC | PRN

## 2020-03-26 MED ORDER — DIBUCAINE (PERIANAL) 1 % EX OINT
TOPICAL_OINTMENT | Freq: Once | CUTANEOUS | Status: AC
  Filled 2020-03-26: qty 28

## 2020-03-26 MED ORDER — HYDROCORTISONE ACE-PRAMOXINE 1-1 % EX CREA: 1.0000 "application " | TOPICAL_CREAM | Freq: Two times a day (BID) | CUTANEOUS | 0 refills | Status: AC

## 2020-03-26 MED ORDER — HYDROCODONE-ACETAMINOPHEN 5-325 MG PO TABS
1.0000 | ORAL_TABLET | Freq: Once | ORAL | Status: AC
Start: 2020-03-26 — End: 2020-03-26
  Administered 2020-03-26: 18:00:00 1 via ORAL
  Filled 2020-03-26: qty 1

## 2020-03-26 MED ORDER — HYDROCORTISONE ACETATE 25 MG RE SUPP: 25.0000 mg | Freq: Two times a day (BID) | RECTAL | 0 refills | Status: DC

## 2020-03-26 MED ORDER — DIBUCAINE (PERIANAL) 1 % EX OINT: 1.0000 "application " | TOPICAL_OINTMENT | Freq: Three times a day (TID) | CUTANEOUS | 0 refills | Status: AC | PRN

## 2020-03-26 NOTE — ED Notes (Signed)
See triage note. Pt denies bleeding; states "I can just see the hemorrhoids popping up and I feel lots of pressure there" (pt pointed at rectum). Pt generally uncomfortable; currently standing at bedside as she states she doesn't feel she can sit on the stretcher d/t discomfort. Steady. Purposefully rocking back and forth on feet to help alleviate discomfort.

## 2020-03-26 NOTE — Discharge Instructions (Signed)
You have been treated with topical pain medicine, a steroid suppository, and oral pain medicine for your hemorrhoids.  You should continue use prescription locations as needed.  Follow-up with your OB provider for ongoing symptom management.  Consider GI referral if symptoms persist.  Return to the ED if needed.

## 2020-03-26 NOTE — ED Provider Notes (Signed)
[facility] Emergency Department Provider Note ____________________________________________  Time seen: 1540  I have reviewed the triage vital signs and the nursing notes.  HISTORY  Chief Complaint  Hemorrhoids  HPI Lindsey Whitney is a 39 y.o. female at  [redacted] weeks gestation, presents to the ED for 2 large hemorrhoids over the last 2 days.  Patient describes onset after she had some persistent diarrhea.  She denies any rectal bleeding, fecal incontinence, or constipation.  She denies any pregnancy related complaints at this time.  She is utilized cold compresses, warm sitz bath's, and over-the-counter hemorrhoid preparation without benefit.  Denies any history of chronic ongoing hemorrhoids.  Past Medical History:  Diagnosis Date  . Anemia    2020 pregnancy  . Medical history non-contributory   . No known health problems   . Pregnancy induced hypertension    2020 pregnancy    Patient Active Problem List   Diagnosis Date Noted  . Anemia in pregnancy 02/04/2020  . UTI (urinary tract infection) during pregnancy GBS 10/22/19 10/27/2019  . Supervision of high risk pregnancy in first trimester 10/22/2019  . Supervision of elderly multigravida in first trimester 39 yo 10/22/2019  . History of vaginal birth after cesarean--desires TOLAC 10/22/2019  . Obesity affecting pregnancy BMI=31.6 10/22/2019    Past Surgical History:  Procedure Laterality Date  . CESAREAN SECTION     2009    Prior to Admission medications   Medication Sig Start Date End Date Taking? Authorizing Provider  dibucaine (NUPERCAINAL) 1 % OINT Place 1 application rectally 3 (three) times daily as needed for hemorrhoids. 03/26/20   Linah Klapper, Charlesetta Ivory, PA-C  ferrous sulfate 324 (65 Fe) MG TBEC Take 1 tablet (325 mg total) by mouth daily. 02/04/20   Sciora, Austin Miles, CNM  HYDROcodone-acetaminophen (NORCO) 5-325 MG tablet Take 1 tablet by mouth 3 (three) times daily as needed. 03/26/20    Kentrel Clevenger, Charlesetta Ivory, PA-C  pramoxine-hydrocortisone (PROCTOCREAM-HC) 1-1 % rectal cream Place 1 application rectally 2 (two) times daily. 03/26/20   Reylynn Vanalstine, Charlesetta Ivory, PA-C  Prenatal Vit-Fe Fumarate-FA (MULTIVITAMIN-PRENATAL) 27-0.8 MG TABS tablet Take 1 tablet by mouth daily.     [provider]    Allergies Patient has no known allergies.  Family History  Problem Relation Age of Onset  . Fibroids Sister     Social History Social History   Tobacco Use  . Smoking status: Never Smoker  . Smokeless tobacco: Never Used  Vaping Use  . Vaping Use: Never used  Substance Use Topics  . Alcohol use: Never  . Drug use: Never    Review of Systems  Constitutional: Negative for fever. Eyes: Negative for visual changes. ENT: Negative for sore throat. Cardiovascular: Negative for chest pain. Respiratory: Negative for shortness of breath. Gastrointestinal: Negative for abdominal pain, vomiting and diarrhea.  Rectal pain and hemorrhoids as above. Genitourinary: Negative for dysuria. Musculoskeletal: Negative for back pain. Skin: Negative for rash. Neurological: Negative for headaches, focal weakness or numbness. ____________________________________________  PHYSICAL EXAM:  VITAL SIGNS: ED Triage Vitals  Enc Vitals Group     BP 03/26/20 1436 128/83     Pulse Rate 03/26/20 1436 92     Resp 03/26/20 1436 20     Temp 03/26/20 1436 98.7 F (37.1 C)     Temp Source 03/26/20 1436 Oral     SpO2 03/26/20 1436 99 %     Weight 03/26/20 1436 219 lb (99.3 kg)     Height 03/26/20  1436 5\' 5"  (1.651 m)     Head Circumference --      Peak Flow --      Pain Score 03/26/20 1435 10     Pain Loc --      Pain Edu? --      Excl. in GC? --     Constitutional: Alert and oriented. Well appearing and in no distress. Head: Normocephalic and atraumatic. Eyes: Conjunctivae are normal. Normal extraocular movements Cardiovascular: Normal rate, regular rhythm. Normal distal  pulses. Respiratory: Normal respiratory effort. No wheezes/rales/rhonchi. Gastrointestinal: gravid, soft and nontender. No distention. Gross rectal exam reveal severely enlarged, engorged, non-thrombosed external hemorrhoids. No internal hemorrhoids on DRE. Musculoskeletal: Nontender with normal range of motion in all extremities.  Neurologic:  Normal gait without ataxia. Normal speech and language. No gross focal neurologic deficits are appreciated. Skin:  Skin is warm, dry and intact. No rash noted. Psychiatric: Mood and affect are normal. Patient exhibits appropriate insight and judgment. ____________________________________________  PROCEDURES  Dibucaine topical Hydrocortisone rectal suppository Norco 5-325 mg PO  Procedures ____________________________________________  INITIAL IMPRESSION / ASSESSMENT AND PLAN / ED COURSE  Female patient [redacted] weeks gestation, presents for acute rectal pain secondary to external hemorrhoids.  Patient treated empirically for her acute pain with topical analgesic, oral pain medicine, and hydrocortisone suppositories.  She is discharged with prescriptions for the same including a Proctofoam cream.  She will follow-up with primary provider for ongoing symptom management.  She is cautioned to take the narcotic pain medicine sparingly for her rectal pain.  Return precautions have been discussed.  Lindsey Whitney was evaluated in Emergency Department on 03/26/2020 for the symptoms described in the history of present illness. She was evaluated in the context of the global COVID-19 pandemic, which necessitated consideration that the patient might be at risk for infection with the SARS-CoV-2 virus that causes COVID-19. Institutional protocols and algorithms that pertain to the evaluation of patients at risk for COVID-19 are in a state of rapid change based on information released by regulatory bodies including the CDC and federal and state organizations. These  policies and algorithms were followed during the patient's care in the ED.  I reviewed the patient's prescription history over the last 12 months in the multi-state controlled substances database(s) that includes [place], [place], [place], [place], [place], [place], [place], [place], [place], [place], [place], [place], [place], and [place].  Results were notable for no RX history.  ____________________________________________  FINAL CLINICAL IMPRESSION(S) / ED DIAGNOSES  Final diagnoses:  Grade II hemorrhoids      [place], Karmen Stabs, PA-C 03/26/20 1809    03/28/20, MD 03/26/20 2252

## 2020-03-26 NOTE — ED Triage Notes (Signed)
Pt comes into the ED via POV c/o of hemorrhoids.  Pt is currently [redacted] weeks pregnant and states she noticed the hemorrhoids 2 days ago after having an upset stomach.  Pt states the pain was tolerable until last night.  Unknown if it is thrombosed at this time, but the patient is unable to put any pressure on her rectum.

## 2020-03-26 NOTE — ED Notes (Signed)
NAD noted at time of D/C. Pt denies comments concerns regarding D/C instructions at this time. Pt given phone to call her husband. Verbal consent for discharge obtained at this time.

## 2020-03-29 ENCOUNTER — Ambulatory Visit: Payer: Self-pay | Attending: Maternal & Fetal Medicine

## 2020-03-29 ENCOUNTER — Other Ambulatory Visit: Payer: Self-pay

## 2020-03-29 ENCOUNTER — Ambulatory Visit: Payer: Self-pay

## 2020-03-29 DIAGNOSIS — Z98891 History of uterine scar from previous surgery: Secondary | ICD-10-CM

## 2020-03-29 DIAGNOSIS — O09523 Supervision of elderly multigravida, third trimester: Secondary | ICD-10-CM | POA: Insufficient documentation

## 2020-03-29 DIAGNOSIS — O99013 Anemia complicating pregnancy, third trimester: Secondary | ICD-10-CM

## 2020-03-29 DIAGNOSIS — O99213 Obesity complicating pregnancy, third trimester: Secondary | ICD-10-CM | POA: Insufficient documentation

## 2020-03-29 DIAGNOSIS — Z3A36 36 weeks gestation of pregnancy: Secondary | ICD-10-CM | POA: Insufficient documentation

## 2020-03-29 DIAGNOSIS — O34219 Maternal care for unspecified type scar from previous cesarean delivery: Secondary | ICD-10-CM | POA: Insufficient documentation

## 2020-03-31 ENCOUNTER — Ambulatory Visit: Payer: Self-pay | Admitting: Advanced Practice Midwife

## 2020-03-31 ENCOUNTER — Encounter: Payer: Self-pay | Admitting: Advanced Practice Midwife

## 2020-03-31 ENCOUNTER — Other Ambulatory Visit: Payer: Self-pay

## 2020-03-31 VITALS — BP 111/75 | HR 88 | Temp 97.5°F | Wt 212.0 lb

## 2020-03-31 DIAGNOSIS — Z98891 History of uterine scar from previous surgery: Secondary | ICD-10-CM

## 2020-03-31 DIAGNOSIS — O99213 Obesity complicating pregnancy, third trimester: Secondary | ICD-10-CM

## 2020-03-31 DIAGNOSIS — O99013 Anemia complicating pregnancy, third trimester: Secondary | ICD-10-CM

## 2020-03-31 DIAGNOSIS — O2343 Unspecified infection of urinary tract in pregnancy, third trimester: Secondary | ICD-10-CM

## 2020-03-31 DIAGNOSIS — O0993 Supervision of high risk pregnancy, unspecified, third trimester: Secondary | ICD-10-CM

## 2020-03-31 DIAGNOSIS — O2341 Unspecified infection of urinary tract in pregnancy, first trimester: Secondary | ICD-10-CM

## 2020-03-31 DIAGNOSIS — O99210 Obesity complicating pregnancy, unspecified trimester: Secondary | ICD-10-CM

## 2020-03-31 DIAGNOSIS — O0991 Supervision of high risk pregnancy, unspecified, first trimester: Secondary | ICD-10-CM

## 2020-03-31 MED ORDER — PRENATAL VITAMINS 28-0.8 MG PO TABS: 1.0000 | ORAL_TABLET | Freq: Every day | ORAL | 0 refills | Status: AC

## 2020-03-31 NOTE — Progress Notes (Signed)
   PRENATAL VISIT NOTE  Subjective:  Lindsey Whitney is a 39 y.o. [id number] at [redacted]w[redacted]d being seen today for ongoing prenatal care.  She is currently monitored for the following issues for this high-risk pregnancy and has Supervision of high risk pregnancy in first trimester; Supervision of elderly multigravida in first trimester 39 yo; History of vaginal birth after cesarean--desires TOLAC; Obesity affecting pregnancy BMI=31.6; UTI (urinary tract infection) during pregnancy GBS 10/22/19; and Anemia in pregnancy on their problem list.  Patient reports hemorrhoids better.  Contractions: Not present. Vag. Bleeding: None.  Movement: Present. Denies leaking of fluid/ROM.   The following portions of the patient's history were reviewed and updated as appropriate: allergies, current medications, past family history, past medical history, past social history, past surgical history and problem list. Problem list updated.  Objective:  There were no vitals filed for this visit.  Fetal Status: Fetal Heart Rate (bpm): 160 Fundal Height: 39 cm Movement: Present  Presentation: Vertex  General:  Alert, oriented and cooperative. Patient is in no acute distress.  Skin: Skin is warm and dry. No rash noted.   Cardiovascular: Normal heart rate noted  Respiratory: Normal respiratory effort, no problems with respiration noted  Abdomen: Soft, gravid, appropriate for gestational age.  Pain/Pressure: Absent     Pelvic: Cervical exam deferred        Extremities: Normal range of motion.  Edema: None  Mental Status: Normal mood and affect. Normal behavior. Normal judgment and thought content.   Assessment and Plan:  Pregnancy: [id number] at [redacted]w[redacted]d  1. Anemia during pregnancy in third trimester Taking FeS04 I daily with oj Hgb 03/03/20=11.3  2. Supervision of high risk pregnancy in first trimester States went to ER 03/26/20 for hemorrhoid pain and given Norco 5-325 mg TID #10 which she has taken all Was also given  Nupercainal 1% TID and Proctocream HC 1% BID which is helping, as well as ice Knows when to go to L&D.  Has car seat.  Wants Nexplanon pp for birth control Reviewed 03/29/20 u/s at 35 6/7 with EFW=28%, vtx, anterior placenta, AFI=wnl Pt self collected GC/chlamydia cultures  - Chlamydia/GC NAA, Confirmation  3. Obesity affecting pregnancy, antepartum 24 lb (10.9 kg)   4. History of vaginal birth after cesarean--desires TOLAC Approved for TOLAC at delivery plans appt  5. Urinary tract infection in mother during first trimester of pregnancy GBS + UTI 10/22/19   Preterm labor symptoms and general obstetric precautions including but not limited to vaginal bleeding, contractions, leaking of fluid and fetal movement were reviewed in detail with the patient. Please refer to After Visit Summary for other counseling recommendations.  No follow-ups on file.  Future Appointments  Date Time Provider Department Center  04/07/2020 11:00 AM [facility] PROVIDER [facility] None    Alberteen Spindle, CNM

## 2020-03-31 NOTE — Addendum Note (Signed)
Addended by: Sharlyne Pacas on: 03/31/2020 02:13 PM   Modules accepted: Orders

## 2020-03-31 NOTE — Progress Notes (Signed)
Presents for MH RV at 36.5 weeks. Takes PNV and iron daily. PNV given today. Seen and treated  in ED on 12/24 for hemorrhoid pain. 36 wk labs collected today (No GBS collected d/t history of GBS UTI.) Sharlyne Pacas, RN

## 2020-04-01 LAB — CHLAMYDIA/GC NAA, CONFIRMATION
Chlamydia trachomatis, NAA: NEGATIVE
Neisseria gonorrhoeae, NAA: NEGATIVE

## 2020-04-03 NOTE — L&D Delivery Note (Signed)
Vaginal Delivery Note  Spontaneous delivery of live viable female infant from the LOA position through an intact perineum. Delivery of anterior right shoulder with gentle downward guidance followed by delivery of the left posterior shoulder with gentle upward guidance. Body followed spontaneously. Infant placed on maternal chest. Nursery present and helped with neonatal resuscitation and evaluation. Cord clamped and cut after one minute. Cord blood collected. Placenta delivered spontaneously and intact with a 3 vessel cord.  1st degree perineal and small right labial laceration. Uterus firm and below umbilicus at the end of the delivery.  Mom and baby recovering in stable condition. Sponge and needle counts were correct at the end of the delivery.  APGARS: 1 minute:8   5 minutes: 9 Weight: pending No epidural  Adelene Idler MD [facility], [facility] 04/25/20 9:20 AM

## 2020-04-07 ENCOUNTER — Other Ambulatory Visit: Payer: Self-pay

## 2020-04-07 ENCOUNTER — Ambulatory Visit: Payer: Self-pay | Admitting: Family Medicine

## 2020-04-07 VITALS — BP 111/67 | HR 87 | Temp 98.5°F | Wt 213.2 lb

## 2020-04-07 DIAGNOSIS — O0991 Supervision of high risk pregnancy, unspecified, first trimester: Secondary | ICD-10-CM

## 2020-04-07 DIAGNOSIS — O99013 Anemia complicating pregnancy, third trimester: Secondary | ICD-10-CM

## 2020-04-07 DIAGNOSIS — O0993 Supervision of high risk pregnancy, unspecified, third trimester: Secondary | ICD-10-CM

## 2020-04-07 DIAGNOSIS — Z98891 History of uterine scar from previous surgery: Secondary | ICD-10-CM

## 2020-04-07 DIAGNOSIS — O99213 Obesity complicating pregnancy, third trimester: Secondary | ICD-10-CM

## 2020-04-07 DIAGNOSIS — O99210 Obesity complicating pregnancy, unspecified trimester: Secondary | ICD-10-CM

## 2020-04-07 NOTE — Progress Notes (Signed)
   PRENATAL VISIT NOTE  Subjective:  Lindsey Whitney is a 40 y.o. [id number] at [redacted]w[redacted]d being seen today for ongoing prenatal care.  She is currently monitored for the following issues for this high-risk pregnancy and has Supervision of high risk pregnancy in first trimester; Supervision of elderly multigravida in first trimester 40 yo; History of vaginal birth after cesarean--desires TOLAC; Obesity affecting pregnancy BMI=31.6; UTI (urinary tract infection) during pregnancy GBS 10/22/19; and Anemia in pregnancy on their problem list.  Patient reports no complaints.  Contractions: Irritability. Vag. Bleeding: None.  Movement: Present. Denies leaking of fluid/ROM.   The following portions of the patient's history were reviewed and updated as appropriate: allergies, current medications, past family history, past medical history, past social history, past surgical history and problem list. Problem list updated.  Objective:   Vitals:   04/07/20 1115  BP: 111/67  Pulse: 87  Temp: 98.5 F (36.9 C)  Weight: 213 lb 3.2 oz (96.7 kg)    Fetal Status: Fetal Heart Rate (bpm): 159 Fundal Height: 39 cm Movement: Present  Presentation: Vertex  General:  Alert, oriented and cooperative. Patient is in no acute distress.  Skin: Skin is warm and dry. No rash noted.   Cardiovascular: Normal heart rate noted  Respiratory: Normal respiratory effort, no problems with respiration noted  Abdomen: Soft, gravid, appropriate for gestational age.  Pain/Pressure: Absent     Pelvic: Cervical exam deferred        Extremities: Normal range of motion.  Edema: None  Mental Status: Normal mood and affect. Normal behavior. Normal judgment and thought content.   Assessment and Plan:  Pregnancy: W6O0355 at [redacted]w[redacted]d  1. Supervision of high risk pregnancy in first trimester Reports although better still has hemorrhoids - reviewed up to date  discussed using dietary and lifestyle modification, Kegel exercises, lying on the left  side, and the use of mild laxatives and stool softeners to avoid constipation.   Reports being baby ready Baby ready-  patient has car seat, explained that fire department can install/ check car seat. Also has sleeping area ready for baby.   Patient verbalizes understanding of what to do if labor starts.       2. History of vaginal birth after cesarean--desires TOLAC  Patient has spoken with Dr. Jean [facility]enthal at [facility] about birth plans.    3. Obesity affecting pregnancy, antepartum 1 lb weight gain since last visit   4. Anemia during pregnancy in third trimester  Patient  Reports daily taking  Feso4 last Hgb 11.3    Term labor symptoms and general obstetric precautions including but not limited to vaginal bleeding, contractions, leaking of fluid and fetal movement were reviewed in detail with the patient. Please refer to After Visit Summary for other counseling recommendations.  Return in about 1 week (around 04/14/2020) for routine prenatal care.  Future Appointments  Date Time Provider Department Center  04/14/2020  9:20 AM [facility] PROVIDER [facility] None    Wendi Snipes, FNP

## 2020-04-07 NOTE — Progress Notes (Signed)
Presents for MH RV at 37.5weeks. Takes PNV. Denies ED/Hospital visit since last RV. Sharlyne Pacas, RN

## 2020-04-14 ENCOUNTER — Ambulatory Visit: Payer: Self-pay | Admitting: Advanced Practice Midwife

## 2020-04-14 ENCOUNTER — Other Ambulatory Visit: Payer: Self-pay

## 2020-04-14 VITALS — BP 111/67 | HR 82 | Temp 98.3°F | Wt 212.8 lb

## 2020-04-14 DIAGNOSIS — O0993 Supervision of high risk pregnancy, unspecified, third trimester: Secondary | ICD-10-CM

## 2020-04-14 DIAGNOSIS — O09523 Supervision of elderly multigravida, third trimester: Secondary | ICD-10-CM

## 2020-04-14 DIAGNOSIS — O99013 Anemia complicating pregnancy, third trimester: Secondary | ICD-10-CM

## 2020-04-14 DIAGNOSIS — O0991 Supervision of high risk pregnancy, unspecified, first trimester: Secondary | ICD-10-CM

## 2020-04-14 DIAGNOSIS — O09521 Supervision of elderly multigravida, first trimester: Secondary | ICD-10-CM

## 2020-04-14 DIAGNOSIS — O99213 Obesity complicating pregnancy, third trimester: Secondary | ICD-10-CM

## 2020-04-14 DIAGNOSIS — Z98891 History of uterine scar from previous surgery: Secondary | ICD-10-CM

## 2020-04-14 DIAGNOSIS — O99210 Obesity complicating pregnancy, unspecified trimester: Secondary | ICD-10-CM

## 2020-04-14 NOTE — Progress Notes (Addendum)
Patient here for MH RV at 38 5/7. No hospital visits since last RV. Patient plans covid vaccines after baby is born .Burt Knack, RN

## 2020-04-14 NOTE — Progress Notes (Signed)
   PRENATAL VISIT NOTE  Subjective:  Lindsey Whitney is a 40 y.o. [id number] at [redacted]w[redacted]d being seen today for ongoing prenatal care.  She is currently monitored for the following issues for this high-risk pregnancy and has Supervision of high risk pregnancy in first trimester; Supervision of elderly multigravida in first trimester 40 yo; History of vaginal birth after cesarean--desires TOLAC; Obesity affecting pregnancy BMI=31.6; UTI (urinary tract infection) during pregnancy GBS 10/22/19; and Anemia in pregnancy on their problem list.  Patient reports no complaints.  Contractions: Irregular. Vag. Bleeding: None.  Movement: Present. Denies leaking of fluid/ROM.   The following portions of the patient's history were reviewed and updated as appropriate: allergies, current medications, past family history, past medical history, past social history, past surgical history and problem list. Problem list updated.  Objective:   Vitals:   04/14/20 0927  BP: 111/67  Pulse: 82  Temp: 98.3 F (36.8 C)  Weight: 212 lb 12.8 oz (96.5 kg)    Fetal Status: Fetal Heart Rate (bpm): 130 Fundal Height: 40 cm Movement: Present  Presentation: Vertex  General:  Alert, oriented and cooperative. Patient is in no acute distress.  Skin: Skin is warm and dry. No rash noted.   Cardiovascular: Normal heart rate noted  Respiratory: Normal respiratory effort, no problems with respiration noted  Abdomen: Soft, gravid, appropriate for gestational age.  Pain/Pressure: Absent     Pelvic: Cervical exam deferred        Extremities: Normal range of motion.  Edema: None  Mental Status: Normal mood and affect. Normal behavior. Normal judgment and thought content.   Assessment and Plan:  Pregnancy: [id number] at [redacted]w[redacted]d  1. Supervision of high risk pregnancy in first trimester Feels tired.  5 u/c's/day.  Knows when to go to L&D Has car seat  2. Obesity affecting pregnancy, antepartum 25 lb 12.8 oz (11.7 kg)   3. Anemia  during pregnancy in third trimester Taking FeSo4 I daily with oj  4. History of vaginal birth after cesarean--desires TOLAC Approved for TOLAC by [facility]  5. Supervision of elderly multigravida in first trimester 40 yo To fill out IOL paperwork at next apt   Term labor symptoms and general obstetric precautions including but not limited to vaginal bleeding, contractions, leaking of fluid and fetal movement were reviewed in detail with the patient. Please refer to After Visit Summary for other counseling recommendations.  Return in about 1 week (around 04/21/2020) for routine PNC.  No future appointments.  Alberteen Spindle, CNM

## 2020-04-21 ENCOUNTER — Ambulatory Visit: Payer: Self-pay | Admitting: Advanced Practice Midwife

## 2020-04-21 ENCOUNTER — Other Ambulatory Visit: Payer: Self-pay

## 2020-04-21 VITALS — BP 114/68 | HR 82 | Temp 97.9°F | Wt 214.0 lb

## 2020-04-21 DIAGNOSIS — O09521 Supervision of elderly multigravida, first trimester: Secondary | ICD-10-CM

## 2020-04-21 DIAGNOSIS — Z98891 History of uterine scar from previous surgery: Secondary | ICD-10-CM

## 2020-04-21 DIAGNOSIS — O99013 Anemia complicating pregnancy, third trimester: Secondary | ICD-10-CM

## 2020-04-21 DIAGNOSIS — O99210 Obesity complicating pregnancy, unspecified trimester: Secondary | ICD-10-CM

## 2020-04-21 DIAGNOSIS — O0991 Supervision of high risk pregnancy, unspecified, first trimester: Secondary | ICD-10-CM

## 2020-04-21 NOTE — Progress Notes (Signed)
Presents for MH RV at 39.5 weeks. Takes PNV and iron daily. Denies Ed/Hospital visit since last RV. IOL faxed today. Sharlyne Pacas, RN

## 2020-04-21 NOTE — Progress Notes (Signed)
   PRENATAL VISIT NOTE  Subjective:  Lindsey Whitney is a 40 y.o. [id number] at [redacted]w[redacted]d being seen today for ongoing prenatal care.  She is currently monitored for the following issues for this high-risk pregnancy and has Supervision of high risk pregnancy in first trimester; Supervision of elderly multigravida in first trimester 40 yo; History of vaginal birth after cesarean--desires TOLAC; Obesity affecting pregnancy BMI=31.6; UTI (urinary tract infection) during pregnancy GBS 10/22/19; and Anemia in pregnancy on their problem list.  Patient reports no complaints.  Contractions: Regular. Vag. Bleeding: None.  Movement: Present. Denies leaking of fluid/ROM.   The following portions of the patient's history were reviewed and updated as appropriate: allergies, current medications, past family history, past medical history, past social history, past surgical history and problem list. Problem list updated.  Objective:   Vitals:   04/21/20 1108  BP: 114/68  Pulse: 82  Temp: 97.9 F (36.6 C)  Weight: 214 lb (97.1 kg)    Fetal Status: Fetal Heart Rate (bpm): 150 Fundal Height: 41 cm Movement: Present  Presentation: Vertex  General:  Alert, oriented and cooperative. Patient is in no acute distress.  Skin: Skin is warm and dry. No rash noted.   Cardiovascular: Normal heart rate noted  Respiratory: Normal respiratory effort, no problems with respiration noted  Abdomen: Soft, gravid, appropriate for gestational age.  Pain/Pressure: Absent     Pelvic: Cervical exam performed Dilation: 3.5 Effacement (%): 90 Station: -3  Extremities: Normal range of motion.  Edema: None  Mental Status: Normal mood and affect. Normal behavior. Normal judgment and thought content.   Assessment and Plan:  Pregnancy: K5G2563 at [redacted]w[redacted]d  1. Anemia during pregnancy in third trimester Taking I FeSo4 daily with oj  2. Supervision of elderly multigravida in first trimester 40 yo IOL paperwork completed--pt desires  05/01/20 Knows when to go to L&D.  Has car seat and ready for baby States u/c's q 20-25 min since this am Membranes swept and suggestions given  3. Obesity affecting pregnancy, antepartum 27 lb (12.2 kg) 2 lb wt gain in last week  4. History of vaginal birth after cesarean--desires TOLAC Approved for TOLAC by Dr. Jean Rosenthal  5. Supervision of high risk pregnancy in first trimester    Term labor symptoms and general obstetric precautions including but not limited to vaginal bleeding, contractions, leaking of fluid and fetal movement were reviewed in detail with the patient. Please refer to After Visit Summary for other counseling recommendations.  No follow-ups on file.  No future appointments.  Alberteen Spindle, CNM

## 2020-04-25 ENCOUNTER — Encounter: Payer: Self-pay | Admitting: Obstetrics and Gynecology

## 2020-04-25 ENCOUNTER — Inpatient Hospital Stay
Admission: EM | Admit: 2020-04-25 | Discharge: 2020-04-27 | DRG: 807 | Disposition: A | Discharge status: Home / Self Care | Payer: Medicaid Other | Attending: Obstetrics and Gynecology | Admitting: Obstetrics and Gynecology

## 2020-04-25 ENCOUNTER — Inpatient Hospital Stay: Payer: Medicaid Other | Admitting: Anesthesiology

## 2020-04-25 ENCOUNTER — Other Ambulatory Visit: Payer: Self-pay

## 2020-04-25 DIAGNOSIS — O479 False labor, unspecified: Secondary | ICD-10-CM | POA: Diagnosis present

## 2020-04-25 DIAGNOSIS — O34219 Maternal care for unspecified type scar from previous cesarean delivery: Secondary | ICD-10-CM | POA: Diagnosis present

## 2020-04-25 DIAGNOSIS — O9902 Anemia complicating childbirth: Secondary | ICD-10-CM | POA: Diagnosis present

## 2020-04-25 DIAGNOSIS — D649 Anemia, unspecified: Secondary | ICD-10-CM | POA: Diagnosis present

## 2020-04-25 DIAGNOSIS — O26893 Other specified pregnancy related conditions, third trimester: Secondary | ICD-10-CM | POA: Diagnosis present

## 2020-04-25 DIAGNOSIS — Z98891 History of uterine scar from previous surgery: Secondary | ICD-10-CM

## 2020-04-25 DIAGNOSIS — Z20822 Contact with and (suspected) exposure to covid-19: Secondary | ICD-10-CM | POA: Diagnosis present

## 2020-04-25 DIAGNOSIS — Z3A40 40 weeks gestation of pregnancy: Secondary | ICD-10-CM

## 2020-04-25 DIAGNOSIS — O480 Post-term pregnancy: Secondary | ICD-10-CM

## 2020-04-25 DIAGNOSIS — O99824 Streptococcus B carrier state complicating childbirth: Secondary | ICD-10-CM | POA: Diagnosis present

## 2020-04-25 DIAGNOSIS — O99013 Anemia complicating pregnancy, third trimester: Secondary | ICD-10-CM

## 2020-04-25 LAB — TYPE AND SCREEN
ABO/RH(D): O POS
Antibody Screen: NEGATIVE

## 2020-04-25 LAB — CBC
HCT: 34.9 % — ABNORMAL LOW (ref 36.0–46.0)
Hemoglobin: 11.9 g/dL — ABNORMAL LOW (ref 12.0–15.0)
MCH: 31.4 pg (ref 26.0–34.0)
MCHC: 34.1 g/dL (ref 30.0–36.0)
MCV: 92.1 fL (ref 80.0–100.0)
Platelets: 198 10*3/uL (ref 150–400)
RBC: 3.79 MIL/uL — ABNORMAL LOW (ref 3.87–5.11)
RDW: 13.6 % (ref 11.5–15.5)
WBC: 7.4 10*3/uL (ref 4.0–10.5)
nRBC: 0 % (ref 0.0–0.2)

## 2020-04-25 LAB — SARS CORONAVIRUS 2 BY RT PCR (HOSPITAL ORDER, PERFORMED IN ~~LOC~~ HOSPITAL LAB): SARS Coronavirus 2: NEGATIVE

## 2020-04-25 LAB — RAPID HIV SCREEN (HIV 1/2 AB+AG)
HIV 1/2 Antibodies: NONREACTIVE
HIV-1 P24 Antigen - HIV24: NONREACTIVE

## 2020-04-25 MED ORDER — AMMONIA AROMATIC IN INHA
RESPIRATORY_TRACT | Status: AC
  Filled 2020-04-25: qty 10

## 2020-04-25 MED ORDER — LIDOCAINE HCL (PF) 1 % IJ SOLN
INTRAMUSCULAR | Status: AC
  Filled 2020-04-25: qty 30

## 2020-04-25 MED ORDER — METHYLERGONOVINE MALEATE 0.2 MG/ML IJ SOLN
0.2000 mg | Freq: Once | INTRAMUSCULAR | Status: AC
  Administered 2020-04-25: 0.2 mg via INTRAMUSCULAR

## 2020-04-25 MED ORDER — ACETAMINOPHEN 325 MG PO TABS: 650.0000 mg | ORAL_TABLET | ORAL | Status: DC | PRN

## 2020-04-25 MED ORDER — DIPHENHYDRAMINE HCL 50 MG/ML IJ SOLN: 12.5000 mg | INTRAMUSCULAR | Status: DC | PRN

## 2020-04-25 MED ORDER — EPHEDRINE 5 MG/ML INJ: 10.0000 mg | INTRAVENOUS | Status: DC | PRN

## 2020-04-25 MED ORDER — PHENYLEPHRINE 40 MCG/ML (10ML) SYRINGE FOR IV PUSH (FOR BLOOD PRESSURE SUPPORT): 80.0000 ug | PREFILLED_SYRINGE | INTRAVENOUS | Status: DC | PRN

## 2020-04-25 MED ORDER — DIBUCAINE (PERIANAL) 1 % EX OINT: 1.0000 "application " | TOPICAL_OINTMENT | CUTANEOUS | Status: DC | PRN

## 2020-04-25 MED ORDER — FENTANYL 2.5 MCG/ML W/ROPIVACAINE 0.15% IN NS 100 ML EPIDURAL (ARMC)
EPIDURAL | Status: AC
  Filled 2020-04-25: qty 100

## 2020-04-25 MED ORDER — SODIUM CHLORIDE 0.9 % IV SOLN
2.0000 g | Freq: Once | INTRAVENOUS | Status: AC
  Administered 2020-04-25: 2 g via INTRAVENOUS

## 2020-04-25 MED ORDER — SOD CITRATE-CITRIC ACID 500-334 MG/5ML PO SOLN: 30.0000 mL | ORAL | Status: DC | PRN

## 2020-04-25 MED ORDER — ZOLPIDEM TARTRATE 5 MG PO TABS: 5.0000 mg | ORAL_TABLET | Freq: Every evening | ORAL | Status: DC | PRN

## 2020-04-25 MED ORDER — ONDANSETRON HCL 4 MG/2ML IJ SOLN: 4.0000 mg | Freq: Four times a day (QID) | INTRAMUSCULAR | Status: DC | PRN

## 2020-04-25 MED ORDER — BUTORPHANOL TARTRATE 1 MG/ML IJ SOLN
2.0000 mg | INTRAMUSCULAR | Status: DC | PRN
  Administered 2020-04-25: 2 mg via INTRAVENOUS

## 2020-04-25 MED ORDER — OXYTOCIN BOLUS FROM INFUSION
333.0000 mL | Freq: Once | INTRAVENOUS | Status: DC
  Administered 2020-04-25: 333 mL via INTRAVENOUS

## 2020-04-25 MED ORDER — ACETAMINOPHEN 500 MG PO TABS
1000.0000 mg | ORAL_TABLET | Freq: Four times a day (QID) | ORAL | Status: DC
  Administered 2020-04-25 – 2020-04-26 (×6): 1000 mg via ORAL
  Filled 2020-04-25 (×7): qty 2

## 2020-04-25 MED ORDER — SODIUM CHLORIDE 0.9 % IV SOLN: 1.0000 g | INTRAVENOUS | Status: DC

## 2020-04-25 MED ORDER — IBUPROFEN 600 MG PO TABS
600.0000 mg | ORAL_TABLET | Freq: Four times a day (QID) | ORAL | Status: DC
  Administered 2020-04-25 – 2020-04-27 (×8): 600 mg via ORAL
  Filled 2020-04-25 (×8): qty 1

## 2020-04-25 MED ORDER — ONDANSETRON HCL 4 MG/2ML IJ SOLN: 4.0000 mg | INTRAMUSCULAR | Status: DC | PRN

## 2020-04-25 MED ORDER — FENTANYL-BUPIVACAINE-NACL 0.5-0.125-0.9 MG/250ML-% EP SOLN: 12.0000 mL/h | EPIDURAL | Status: DC | PRN

## 2020-04-25 MED ORDER — DIPHENHYDRAMINE HCL 25 MG PO CAPS: 25.0000 mg | ORAL_CAPSULE | Freq: Four times a day (QID) | ORAL | Status: DC | PRN

## 2020-04-25 MED ORDER — OXYTOCIN-SODIUM CHLORIDE 30-0.9 UT/500ML-% IV SOLN
2.5000 [IU]/h | INTRAVENOUS | Status: DC
  Administered 2020-04-25: 2.5 [IU]/h via INTRAVENOUS

## 2020-04-25 MED ORDER — LACTATED RINGERS IV SOLN: INTRAVENOUS | Status: DC

## 2020-04-25 MED ORDER — OXYCODONE HCL 5 MG PO TABS
5.0000 mg | ORAL_TABLET | Freq: Four times a day (QID) | ORAL | Status: DC | PRN
  Administered 2020-04-25 (×2): 5 mg via ORAL
  Filled 2020-04-25 (×2): qty 1

## 2020-04-25 MED ORDER — WITCH HAZEL-GLYCERIN EX PADS
1.0000 "application " | MEDICATED_PAD | CUTANEOUS | Status: DC | PRN
  Administered 2020-04-25: 1 via TOPICAL
  Filled 2020-04-25: qty 100

## 2020-04-25 MED ORDER — OXYTOCIN-SODIUM CHLORIDE 30-0.9 UT/500ML-% IV SOLN
INTRAVENOUS | Status: AC
  Filled 2020-04-25: qty 500

## 2020-04-25 MED ORDER — ONDANSETRON HCL 4 MG PO TABS
4.0000 mg | ORAL_TABLET | ORAL | Status: DC | PRN
  Filled 2020-04-25: qty 1

## 2020-04-25 MED ORDER — BUTORPHANOL TARTRATE 1 MG/ML IJ SOLN
INTRAMUSCULAR | Status: AC
  Filled 2020-04-25: qty 2

## 2020-04-25 MED ORDER — DOCUSATE SODIUM 100 MG PO CAPS
100.0000 mg | ORAL_CAPSULE | Freq: Two times a day (BID) | ORAL | Status: DC
  Administered 2020-04-25 – 2020-04-26 (×3): 100 mg via ORAL
  Filled 2020-04-25 (×3): qty 1

## 2020-04-25 MED ORDER — PRENATAL MULTIVITAMIN CH
1.0000 | ORAL_TABLET | Freq: Every day | ORAL | Status: DC
  Administered 2020-04-25 – 2020-04-26 (×2): 1 via ORAL
  Filled 2020-04-25 (×2): qty 1

## 2020-04-25 MED ORDER — LIDOCAINE HCL (PF) 1 % IJ SOLN
30.0000 mL | INTRAMUSCULAR | Status: AC | PRN
  Administered 2020-04-25: 30 mL via SUBCUTANEOUS

## 2020-04-25 MED ORDER — BENZOCAINE-MENTHOL 20-0.5 % EX AERO
1.0000 "application " | INHALATION_SPRAY | CUTANEOUS | Status: DC | PRN
  Administered 2020-04-25: 1 via TOPICAL
  Filled 2020-04-25: qty 56

## 2020-04-25 MED ORDER — OXYTOCIN 10 UNIT/ML IJ SOLN
INTRAMUSCULAR | Status: AC
  Filled 2020-04-25: qty 2

## 2020-04-25 MED ORDER — SODIUM CHLORIDE 0.9 % IV SOLN
INTRAVENOUS | Status: AC
  Filled 2020-04-25: qty 2000

## 2020-04-25 MED ORDER — METHYLERGONOVINE MALEATE 0.2 MG/ML IJ SOLN
INTRAMUSCULAR | Status: AC
  Filled 2020-04-25: qty 1

## 2020-04-25 MED ORDER — MISOPROSTOL 200 MCG PO TABS
ORAL_TABLET | ORAL | Status: AC
  Filled 2020-04-25: qty 4

## 2020-04-25 MED ORDER — LACTATED RINGERS IV SOLN: 500.0000 mL | Freq: Once | INTRAVENOUS | Status: DC

## 2020-04-25 MED ORDER — COCONUT OIL OIL
1.0000 "application " | TOPICAL_OIL | Status: DC | PRN
  Filled 2020-04-25: qty 120

## 2020-04-25 MED ORDER — LACTATED RINGERS IV SOLN: 500.0000 mL | INTRAVENOUS | Status: DC | PRN

## 2020-04-25 MED ORDER — SIMETHICONE 80 MG PO CHEW: 80.0000 mg | CHEWABLE_TABLET | ORAL | Status: DC | PRN

## 2020-04-25 NOTE — Anesthesia Preprocedure Evaluation (Deleted)
Anesthesia Evaluation  Patient identified by MRN, date of birth, ID band Patient awake    Reviewed: Allergy & Precautions, NPO status , Patient's Chart, lab work & pertinent test results  History of Anesthesia Complications Negative for: history of anesthetic complications  Airway Mallampati: II  TM Distance: >3 FB Neck ROM: Full    Dental no notable dental hx.    Pulmonary neg pulmonary ROS, neg sleep apnea, neg COPD,    breath sounds clear to auscultation- rhonchi (-) wheezing      Cardiovascular Exercise Tolerance: Good hypertension, (-) CAD and (-) Past MI  Rhythm:Regular Rate:Normal - Systolic murmurs and - Diastolic murmurs    Neuro/Psych negative neurological ROS  negative psych ROS   GI/Hepatic negative GI ROS, Neg liver ROS,   Endo/Other  negative endocrine ROSneg diabetes  Renal/GU negative Renal ROS  negative genitourinary   Musculoskeletal negative musculoskeletal ROS (+)   Abdominal (+) + obese, Gravid abdomen  Peds negative pediatric ROS (+)  Hematology  (+) anemia ,   Anesthesia Other Findings First pregnancy resulted in CS due to breech position, second delivery was successful VBAC  Reproductive/Obstetrics (+) Pregnancy                             Lab Results  Component Value Date   WBC 7.4 04/25/2020   HGB 11.9 (L) 04/25/2020   HCT 34.9 (L) 04/25/2020   MCV 92.1 04/25/2020   PLT 198 04/25/2020    Anesthesia Physical  Anesthesia Plan  ASA: II  Anesthesia Plan: Epidural   Post-op Pain Management:    Induction:   PONV Risk Score and Plan: 2  Airway Management Planned:   Additional Equipment:   Intra-op Plan:   Post-operative Plan:   Informed Consent: I have reviewed the patients History and Physical, chart, labs and discussed the procedure including the risks, benefits and alternatives for the proposed anesthesia with the patient or authorized  representative who has indicated his/her understanding and acceptance.       Plan Discussed with: CRNA and Anesthesiologist  Anesthesia Plan Comments: (Plan for epidural for labor, discussed epidural vs spinal vs GA if need for csection)        Anesthesia Quick Evaluation

## 2020-04-25 NOTE — H&P (Signed)
Lindsey Whitney is an 40 y.o. female.   Chief Complaint: Uterine contractions HPI: She presents today with complaints of contractions. She reports she had been having contractions overnight. The contractions worsened about an hour ago. She denies leakage of fluid. She denies vaginal bleeding. She reports normal fetal movement.   OB history has been complicated by:  1. History of prior cesarean- desires TOLAC. She has had two prior VBAC.  Prior cesarean for breech position.  2. Maternal Anemia 3. Advanced maternal age  Pregnant Problems (from 10/17/19 to present)    Problem Noted Resolved   Anemia in pregnancy 02/04/2020 by Sharlyne Pacas, RN No   Overview Signed 02/05/2020  2:55 PM by Landry Dyke, PA-C    Anemia panel suggestive of iron deficiency Oral iron started 02/04/20 [ ]  recheck hgb 1 mo         Nursing Staff Provider  Office Location  [facility] Dating   14 wk  Language  English Anatomy US   normal  Flu Vaccine  01/22/2020 Genetic Screen  NIPS: normal xy  TDaP vaccine   02/04/2020 Hgb A1C or  GTT Third trimester : 88  Covid    LAB RESULTS   Rhogam  Not needed Blood Type --/--/PENDING (01/23 0716)   Feeding Plan  Antibody PENDING (01/23 0716)  Contraception  Rubella    Circumcision  RPR Non Reactive (11/03 1136)   Pediatrician   HBsAg Negative (07/21 1022)   Support Person  HIV NON REACTIVE (01/23 [birth year]-1977)  Prenatal Classes  Varicella     GBS   Positive urine  BTL Consent     VBAC Consent  completed Pap      Hgb Electro   negative    CF      SMA            Past Medical History:  Diagnosis Date  . Anemia    2020 pregnancy  . Medical history non-contributory   . No known health problems   . Pregnancy induced hypertension    2020 pregnancy    Past Surgical History:  Procedure Laterality Date  . CESAREAN SECTION     2009    Family History  Problem Relation Age of Onset  . Fibroids Sister    Social History:  reports that she has never  smoked. She has never used smokeless tobacco. She reports that she does not drink alcohol and does not use drugs.  Allergies: No Known Allergies  Medications Prior to Admission  Medication Sig Dispense Refill  . dibucaine (NUPERCAINAL) 1 % OINT Place 1 application rectally 3 (three) times daily as needed for hemorrhoids. (Patient not taking: Reported on 04/21/2020) 30 g 0  . ferrous sulfate 324 (65 Fe) MG TBEC Take 1 tablet (325 mg total) by mouth daily. 100 tablet 0  . HYDROcodone-acetaminophen (NORCO) 5-325 MG tablet Take 1 tablet by mouth 3 (three) times daily as needed. (Patient not taking: No sig reported) 10 tablet 0  . pramoxine-hydrocortisone (PROCTOCREAM-HC) 1-1 % rectal cream Place 1 application rectally 2 (two) times daily. (Patient not taking: Reported on 04/21/2020) 30 g 0  . Prenatal Vit-Fe Fumarate-FA (PRENATAL VITAMINS) 28-0.8 MG TABS Take 1 tablet by mouth daily. 100 tablet 0    Results for orders placed or performed during the hospital encounter of 04/25/20 (from the past 48 hour(s))  Type and screen [facility]     Status: None (Preliminary result)   Collection Time: 04/25/20  7:16 AM  Result Value Ref Range   ABO/RH(D) PENDING    Antibody Screen PENDING    Sample Expiration      04/28/2020,2359 Performed at [facility], [address] [place], [place] [address]   Rapid HIV screen (HIV 1/2 Ab+Ag) ([facility] Only)     Status: None   Collection Time: 04/25/20  7:18 AM  Result Value Ref Range   HIV-1 P24 Antigen - HIV24 NON REACTIVE NON REACTIVE    Comment: (NOTE) Detection of p24 may be inhibited by biotin in the sample, causing false negative results in acute infection.    HIV 1/2 Antibodies NON REACTIVE NON REACTIVE   Interpretation (HIV Ag Ab)      A non reactive test result means that HIV 1 or HIV 2 antibodies and HIV 1 p24 antigen were not detected in the specimen.    Comment: Performed at [facility], [address]  [place], [place] [address]  CBC     Status: Abnormal   Collection Time: 04/25/20  7:18 AM  Result Value Ref Range   WBC 7.4 4.0 - 10.5 K/uL   RBC 3.79 (L) 3.87 - 5.11 MIL/uL   Hemoglobin 11.9 (L) 12.0 - 15.0 g/dL   HCT 22.9 (L) 79.8 - 92.1 %   MCV 92.1 80.0 - 100.0 fL   MCH 31.4 26.0 - 34.0 pg   MCHC 34.1 30.0 - 36.0 g/dL   RDW 19.4 17.4 - 08.1 %   Platelets 198 150 - 400 K/uL   nRBC 0.0 0.0 - 0.2 %    Comment: Performed at [facility], [address] [place], [place] [address]   No results found.  Review of Systems  Constitutional: Negative for chills and fever.  HENT: Negative for congestion, hearing loss and sinus pain.   Respiratory: Negative for cough, shortness of breath and wheezing.   Cardiovascular: Negative for chest pain, palpitations and leg swelling.  Gastrointestinal: Negative for abdominal pain, constipation, diarrhea, nausea and vomiting.  Genitourinary: Negative for dysuria, flank pain, frequency, hematuria and urgency.  Musculoskeletal: Negative for back pain.  Skin: Negative for rash.  Neurological: Negative for dizziness and headaches.  Psychiatric/Behavioral: Negative for suicidal ideas. The patient is not nervous/anxious.     Blood pressure 110/67, pulse 66, temperature 98.5 F (36.9 C), temperature source Oral, resp. rate 18, last menstrual period 07/27/2019, unknown if currently breastfeeding. Physical Exam Vitals and nursing note reviewed.  Constitutional:      Appearance: She is well-developed and well-nourished.  HENT:     Head: Normocephalic and atraumatic.  Cardiovascular:     Rate and Rhythm: Normal rate and regular rhythm.  Pulmonary:     Effort: Pulmonary effort is normal.     Breath sounds: Normal breath sounds.  Abdominal:     General: Bowel sounds are normal.     Palpations: Abdomen is soft.  Musculoskeletal:        General: Normal range of motion.  Skin:    General: Skin is warm and dry.  Neurological:     Mental Status:  She is alert and oriented to person, place, and time.  Psychiatric:        Mood and Affect: Mood and affect normal.        Behavior: Behavior normal.        Thought Content: Thought content normal.        Judgment: Judgment normal.      Assessment/Plan  40 yo [id number] [redacted]w[redacted]d  in active labor desires  TOLAC 1. Understands the risks of TOLAC and uterine rupture, desires to proceed with TOLAC>  2. GBS +- will treat  3. Epidural desired 4. Normal admission labs 5. Expectant management of labor   Natale Milch, MD 04/25/2020, 7:53 AM

## 2020-04-25 NOTE — Progress Notes (Signed)
Pt presents to L&D c/o ctx. Pt SROM when she got to the labor room at 0655 with clear fluid, SVE 7/80/-1. Pt reports ctx started an hour ago. Pt desires an epidural. Pt had a c/s with her first baby in 2009  and then 2 successful VBACs. C.Schuman, MD aware and placed orders.

## 2020-04-25 NOTE — Progress Notes (Signed)
Lindsey Whitney is stable after vaginal  Delivery at 0839. Pt's support person is at bedside. Pt bonding with infant and performing skin to skin after delivery.  Pt is stable and ambulatory. Pt ambulated to the bathroom and voided. Pt tolerated activity. Pt transferred to mother/baby unit, RM 338. Report given to Frann Rider, RN

## 2020-04-25 NOTE — Lactation Note (Signed)
This note was copied from a baby's chart. Lactation Consultation Note  Patient Name: Lindsey Whitney Date: 04/25/2020 Reason for consult: Initial assessment Age:38 hours Lactation to the room for initial visit. Mother is resting in the bed, baby is swaddled in the bassinet. Encouraged feeding on demand and with cues. If baby is not cueing encouraged hand expression and skin to skin. Mother stated she BF her other children without difficulty. Encouraged 8 or more attempts in the first 24 hours and 8 or more good feeds after 24 HOL. Reviewed appropriate diapers for days of life and How to know your baby is getting enough to eat. Reviewed "Understanding Postpartum and Newborn Care" booklet at bedside. [facility] # left on board, encouraged to call for any assistance. Mother has no further questions at this time.   Maternal Data Formula Feeding for Exclusion: No Has patient been taught Hand Expression?: Yes Does the patient have breastfeeding experience prior to this delivery?: Yes  Feeding Feeding Type: Breast Fed  LATCH Score Latch: Grasps breast easily, tongue down, lips flanged, rhythmical sucking.  Audible Swallowing: Spontaneous and intermittent  Type of Nipple: Everted at rest and after stimulation  Comfort (Breast/Nipple): Soft / non-tender  Hold (Positioning): No assistance needed to correctly position infant at breast.  LATCH Score: 10  Interventions Interventions: Breast feeding basics reviewed  Lactation Tools Discussed/Used  Spoons left at bedside   Consult Status Consult Status: Follow-up Date: 04/26/20 Follow-up type: Call as needed    Madhav Mohon D Kamel [place] 04/25/2020, 1:07 PM

## 2020-04-25 NOTE — Discharge Summary (Addendum)
Postpartum Discharge Summary  Date of Service updated 04/27/20    Patient Name: Lindsey Whitney DOB: 05-Sep-1980 MRN: [id number]  Date of admission: 04/25/2020 Delivery date:04/25/2020  Delivering provider: Adrian Prows R  Date of discharge: 04/27/2020  Admitting diagnosis: Uterine contractions during pregnancy [O47.9] Intrauterine pregnancy: [redacted]w[redacted]d    Secondary diagnosis:  Principal Problem:   Uterine contractions during pregnancy Active Problems:   History of vaginal birth after cesarean--desires TOLAC Advance maternal age History of cesarean deliver- successful VBAC Anemia in pregnnacy  Additional problems: none    Discharge diagnosis: Term Pregnancy Delivered                                              Post partum procedures:none Augmentation: N/A Complications: None  Hospital course: Onset of Labor With Vaginal Delivery      40y.o. yo [id number] admitted in Active Labor on 04/25/2020. Patient had an uncomplicated labor course as follows:  Membrane Rupture Time/Date:  ,   Delivery Method:VBAC, Spontaneous  Episiotomy: None  Lacerations:  1st degree;Labial  Patient had an uncomplicated postpartum course.  She is ambulating, tolerating a regular diet, passing flatus, and urinating well. Patient is discharged home in stable condition on 04/27/20.  Newborn Data: Birth date:04/25/2020  Birth time:8:36 AM  Gender:Female  Living status:Living  Apgars:8 ,9  Weight:3700 g   Magnesium Sulfate received: No BMZ received: No Rhophylac:No MMWEX:HBZJIRCprior immunity in chart but no recent testing T-DaP:Given prenatally Flu: Yes Transfusion:No  Physical exam  Vitals:   04/26/20 0813 04/26/20 1541 04/26/20 2321 04/27/20 0752  BP: 110/71 103/70 114/80 109/75  Pulse: 99 78 74 67  Resp: '20 20 18 20  ' Temp: 98.9 F (37.2 C) 98.9 F (37.2 C) 99.1 F (37.3 C) 98.6 F (37 C)  TempSrc: Oral Oral Oral Oral  SpO2: 99% 99% 100% 99%  Weight:      Height:        General: alert, cooperative and no distress Lochia: appropriate Uterine Fundus: firm Incision: N/A DVT Evaluation: No evidence of DVT seen on physical exam. Labs: Lab Results  Component Value Date   WBC 9.3 04/26/2020   HGB 11.5 (L) 04/26/2020   HCT 33.7 (L) 04/26/2020   MCV 91.3 04/26/2020   PLT 181 04/26/2020   CMP Latest Ref Rng & Units 10/22/2019  Glucose 65 - 99 mg/dL 73  BUN 6 - 20 mg/dL 10  Creatinine 0.57 - 1.00 mg/dL 0.56(L)  Sodium 134 - 144 mmol/L 136  Potassium 3.5 - 5.2 mmol/L 3.6  Chloride 96 - 106 mmol/L 102  CO2 20 - 29 mmol/L 20  Calcium 8.7 - 10.2 mg/dL 9.4  Total Protein 6.0 - 8.5 g/dL 7.6  Total Bilirubin 0.0 - 1.2 mg/dL 0.3  Alkaline Phos 48 - 121 IU/L 65  AST 0 - 40 IU/L 15  ALT 0 - 32 IU/L 8   Edinburgh Score: Edinburgh Postnatal Depression Scale Screening Tool 04/25/2020  I have been able to laugh and see the funny side of things. 0  I have looked forward with enjoyment to things. 0  I have blamed myself unnecessarily when things went wrong. 0  I have been anxious or worried for no good reason. 2  I have felt scared or panicky for no good reason. 0  Things have been getting on top of me.  2  I have been so unhappy that I have had difficulty sleeping. 0  I have felt sad or miserable. 0  I have been so unhappy that I have been crying. 0  The thought of harming myself has occurred to me. 0  Edinburgh Postnatal Depression Scale Total 4      After visit meds:  Allergies as of 04/27/2020   No Known Allergies     Medication List    STOP taking these medications   ferrous sulfate 324 (65 Fe) MG Tbec   HYDROcodone-acetaminophen 5-325 MG tablet Commonly known as: Norco     TAKE these medications   dibucaine 1 % Oint Commonly known as: NUPERCAINAL Place 1 application rectally 3 (three) times daily as needed for hemorrhoids.   pramoxine-hydrocortisone 1-1 % rectal cream Commonly known as: PROCTOCREAM-HC Place 1 application rectally 2  (two) times daily.   Prenatal Vitamins 28-0.8 MG Tabs Take 1 tablet by mouth daily.        Discharge home in stable condition Infant Feeding: Bottle and Breast Infant Disposition:home with mother  Discharge instruction: per After Visit Summary and Postpartum booklet. Activity: Advance as tolerated. Pelvic rest for 6 weeks.  Diet: routine diet Anticipated Birth Control: Nexplanon Postpartum Appointment:2 weeks Additional Postpartum F/U: 6 weeks Future Appointments: No future appointments. Follow up Visit:  Follow-up Information    Schuman, Christanna R, MD Follow up in 3 week(s).   Specialty: Obstetrics and Gynecology Contact information: [place] [id number] [phone]               A total of 20 minutes were spent face-to-face with the patient as well as preparation, review, communication, and documentation during this encounter. Discharge planning and postpartum care discussed.      04/27/2020 Hoyt Koch, MD

## 2020-04-26 ENCOUNTER — Encounter: Payer: MEDICAID | Admitting: Obstetrics and Gynecology

## 2020-04-26 LAB — CBC
HCT: 33.7 % — ABNORMAL LOW (ref 36.0–46.0)
Hemoglobin: 11.5 g/dL — ABNORMAL LOW (ref 12.0–15.0)
MCH: 31.2 pg (ref 26.0–34.0)
MCHC: 34.1 g/dL (ref 30.0–36.0)
MCV: 91.3 fL (ref 80.0–100.0)
Platelets: 181 10*3/uL (ref 150–400)
RBC: 3.69 MIL/uL — ABNORMAL LOW (ref 3.87–5.11)
RDW: 13.5 % (ref 11.5–15.5)
WBC: 9.3 10*3/uL (ref 4.0–10.5)
nRBC: 0 % (ref 0.0–0.2)

## 2020-04-26 LAB — RPR: RPR Ser Ql: NONREACTIVE

## 2020-04-26 NOTE — Progress Notes (Addendum)
Post Partum Day 0 Subjective: no complaints, up ad lib, voiding, tolerating PO and she is very comfortabel with breastfeeding. she would like to have Nexplanon placed, and is aware that this can be done at the [facility]  Objective: Blood pressure 110/71, pulse 99, temperature 98.9 F (37.2 C), temperature source Oral, resp. rate 20, height 5\' 5"  (1.651 m), weight 98 kg, last menstrual period 07/27/2019, SpO2 99 %, unknown if currently breastfeeding.  Physical Exam:  General: alert, cooperative and no distress Lochia: appropriate Uterine Fundus: firm Incision: healing well DVT Evaluation: No evidence of DVT seen on physical exam. Negative Homan's sign.  Recent Labs    04/25/20 0718 04/26/20 0432  HGB 11.9* 11.5*  HCT 34.9* 33.7*    Assessment/Plan: Plan for discharge tomorrow and Contraception plans Nexplanon   A total of 30 minutes is spent providing direct patient care, education and planning for discharge  and follow up as well as in records review.   LOS: 1 day   04/28/20 04/26/2020, 9:23 AM

## 2020-04-27 LAB — RUBELLA ANTIBODY, IGM: Rubella IgM: <20 AU/mL (ref 0.0–19.9)

## 2020-04-27 NOTE — Discharge Instructions (Signed)
Discharge Instructions:   If there are any new medications, they have been ordered and will be available for pickup at the listed pharmacy on your way home from the hospital.   Call the office if you have any of the following: headache, visual changes, fever >101.0 F, chills, shortness of breath, breast concerns, excessive vaginal bleeding, incision drainage or problems, leg pain or redness, depression or any other concerns. If you have vaginal discharge with an odor, let your doctor know.   It is normal to bleed for up to 6 weeks. You should not soak through more than 1 pad in 1 hour. If you have a blood clot larger than your fist with continued bleeding, call your doctor.   Activity: Do not lift > 15 lbs for 6 weeks (do not lift anything heavier than your baby). No intercourse, tampons, swimming pools, hot tubs, baths (only showers) for 6 weeks.  No driving for 1-2 weeks. Do not drive while taking narcotic or opioid pain medication.  Continue taking your prenatal vitamin, especially if breastfeeding. Increase calories and fluids (water) while breastfeeding.   Your milk will come in, in the next couple of days (right now it is colostrum). You may have a slight fever when your milk comes in, but it should go away on its own.  If it does not, and rises above 101 F please call the doctor. You will also feel achy and your breasts will be firm. They will also start to leak. If you are breastfeeding, continue as you have been and you can pump/express milk for comfort.   If you have too much milk, your breasts can become engorged, which could lead to mastitis. This is an infection of the milk ducts. It can be very painful and you will need to notify your doctor to obtain a prescription for antibiotics. You can also treat it with a shower or hot/cold compress.   For concerns about your baby, please call your pediatrician.  For breastfeeding concerns, the lactation consultant can be reached at  [phone].   Postpartum blues (feelings of happy one minute and sad another minute) are normal for the first few weeks but if it gets worse let your doctor know.   Congratulations! We enjoyed caring for you and your new bundle of joy!

## 2020-04-27 NOTE — Progress Notes (Signed)
Admit Date: 04/25/2020 Today's Date: 04/27/2020  Post Partum Day 2  Subjective:  no complaints  Objective: Temp:  [98.6 F (37 C)-99.1 F (37.3 C)] 98.6 F (37 C) (01/25 0752) Pulse Rate:  [67-78] 67 (01/25 0752) Resp:  [18-20] 20 (01/25 0752) BP: (103-114)/(70-80) 109/75 (01/25 0752) SpO2:  [99 %-100 %] 99 % (01/25 0752)  Physical Exam:  General: alert, cooperative, appears stated age and no distress Lochia: appropriate Uterine Fundus: firm Incision: none DVT Evaluation: No evidence of DVT seen on physical exam.  Recent Labs    04/25/20 0718 04/26/20 0432  HGB 11.9* 11.5*  HCT 34.9* 33.7*    Assessment/Plan: Discharge home, Breastfeeding, Contraception (nexplanon) and Infant doing well   LOS: 2 days   Letitia Libra [facility] 04/27/2020, 9:41 AM

## 2020-04-27 NOTE — Progress Notes (Signed)
Patient discharged home with infant. Discharge instructions given and reviewed with patient. Patient verbalized understanding. Escorted out by auxillary.

## 2020-04-28 ENCOUNTER — Ambulatory Visit: Payer: Self-pay

## 2020-05-25 NOTE — Addendum Note (Signed)
Addended by: Heywood Bene on: 05/25/2020 02:21 PM   Modules accepted: Orders

## 2020-07-30 ENCOUNTER — Ambulatory Visit: Payer: Self-pay

## 2020-07-30 ENCOUNTER — Ambulatory Visit: Payer: Self-pay | Admitting: Advanced Practice Midwife

## 2020-07-30 ENCOUNTER — Other Ambulatory Visit: Payer: Self-pay

## 2020-07-30 ENCOUNTER — Encounter: Payer: Self-pay | Admitting: Advanced Practice Midwife

## 2020-07-30 DIAGNOSIS — Z30017 Encounter for initial prescription of implantable subdermal contraceptive: Secondary | ICD-10-CM

## 2020-07-30 DIAGNOSIS — Z309 Encounter for contraceptive management, unspecified: Secondary | ICD-10-CM

## 2020-07-30 LAB — PREGNANCY, URINE: Preg Test, Ur: NEGATIVE

## 2020-07-30 LAB — HEMOGLOBIN, FINGERSTICK: Hemoglobin: 11.7 g/dL (ref 11.1–15.9)

## 2020-07-30 MED ORDER — ETONOGESTREL 68 MG ~~LOC~~ IMPL
68.0000 mg | DRUG_IMPLANT | Freq: Once | SUBCUTANEOUS | Status: AC
  Administered 2020-07-30: 68 mg via SUBCUTANEOUS

## 2020-07-30 NOTE — Progress Notes (Signed)
Post Partum Exam  Lindsey Whitney is a 40 y.o. MBF [id number](67,8, 23 mo, baby) nonsmoker female who presents for a postpartum visit. She is 13 weeks postpartum following a spontaneous vaginal delivery. I have fully reviewed the prenatal and intrapartum course. The delivery was at 40 2/7 gestational weeks. SVD with no epidural successful VBAC on 04/25/20 M 8#0 with 1st degree laceration.  Husband helps with kids. Bottlefeeding q 3-4 hours (4-5 oz). LMP 07/23/20. PP sex x2 both unprotected: 07/07/20 and 07/16/20 without condom; with current partner x 15 years; 1 partner in last 3 mo. Denies cigs, ETOH. Last pap 04/29/2018 ASCUS HPV neg.   Anesthesia: none. Postpartum course has been wnl. Baby's course has been wnl. Baby is feeding by Bottle Bleeding no bleeding. Bowel function is normal. Bladder function is normal. Patient is sexually active. Contraception method is none.   Postpartum depression screening:  Edinburgh Postnatal Depression Scale - 07/30/20 1545      Edinburgh Postnatal Depression Scale:  In the Past 7 Days   I have been able to laugh and see the funny side of things. 1    I have looked forward with enjoyment to things. 1    I have blamed myself unnecessarily when things went wrong. 2    I have been anxious or worried for no good reason. 2    I have felt scared or panicky for no good reason. 2    Things have been getting on top of me. 1    I have been so unhappy that I have had difficulty sleeping. 1    I have felt sad or miserable. 1    I have been so unhappy that I have been crying. 1    The thought of harming myself has occurred to me. 0    Edinburgh Postnatal Depression Scale Total 12            The following portions of the patient's history were reviewed and updated as appropriate: allergies, current medications, past family history, past medical history, past social history, past surgical history and problem list. Last pap smear done 04/29/2018  and was Abnormal- ASCUS  HPV neg  Review of Systems Pertinent items are noted in HPI.    Objective:  LMP 07/23/2020   Gen: well appearing, NAD HEENT: no scleral icterus CV: RR Lung: Normal WOB Breast:performed-not indicated  Ext: warm well perfused  GU: vagina wnl and intact with white creamy leukorrhea Uterus involuted NSSC Rectal: performed -  not indicated       Assessment:    13 wks postpartum exam. Pap smear not done at today's visit.   Plan:   Essential components of care per ACOG recommendations for Comprehensive Postpartum exam:  1.  Mood and well being: Patient with positive depression screening today. Reviewed local resources for support. EPDS is high risk. Reviewed resources and that mood sx in first year after pregnancy are considered related to pregnancy and to reach out for help at [facility] if needed. Discussed [facility] as link to care and availability of LCSW for counseling  - Patient does not use tobacco.  - hx of drug use? No    2. Infant care and feeding:  -Patient currently breastmilk feeding? No If breastmilk feeding discussed return to work and pumping. If needed, patient was provided letter for work to allow for every 2-3 hr pumping breaks, and to be granted a private location to express breastmilk and refrigerated area to store breastmilk. Reviewed importance of  draining breast regularly to support lactation. I  -Recommended patient engage with WIC/BFpeer counselors  -Counseled to sign new child up for [facility] services -Social determinants of health (SDOH) reviewed in EPIC. Concern about high Edinburgh=12 neg SI/HI and pt declines need for counseling  3. Sexuality, contraception and birth spacing  Contraception: Contraception counseling: Reviewed all forms of birth control options in the tiered based approach. available including abstinence; over the counter/barrier methods; hormonal contraceptive medication including pill, patch, ring, injection,contraceptive implant; hormonal and  nonhormonal IUDs; permanent sterilization options including vasectomy and the various tubal sterilization modalities. Risks, benefits, and typical effectiveness rates were reviewed.  Questions were answered.  Written information was also given to the patient to review.  Patient desires Nexplanon insertion, this was prescribed for patient. She will follow up in  prn for surveillance.  She was told to call with any further questions, or with any concerns about this method of contraception.  Emphasized use of condoms 100% of the time for STI prevention.  Patient was offered ECP. ECP was not accepted by the patient. ECP counseling was not given - see RN documentation  - Patient does not want a pregnancy in the next year.  Desired family size is 4 children.  - Reviewed forms of contraception in tiered fashion. Patient desired Nexplanon today.   - Discussed birth spacing of 18 months  4. Sleep and fatigue -Encouraged family/partner/community support of 4 hrs of uninterrupted sleep to help with mood and fatigue  5. Physical Recovery  - Discussed patients delivery and complications - Patient had a 1st degree laceration, perineal healing reviewed. Patient expressed understanding - Patient has urinary incontinence? No - Patient is safe to resume physical and sexual activity  6.  Health Maintenance/Chronic Disease Health Maintenance Due  Topic Date Due  . COVID-19 Vaccine (1) Never done    - Last pap smear performed1/27/2020 and was abnormal with neg HPV with negative HPV  1. Postpartum exam Performed by Arnetha Courser, CNM Please give Kathreen Cosier, LCSW contact info to pt  Needs cotest 04/2021 - Hemoglobin, venipuncture - Pregnancy, urine - etonogestrel (NEXPLANON) implant 68 mg  2. Insertion of Nexplanon Done by Dr. Alvester Morin - etonogestrel [facility]) implant 68 mg   Patient given handout about PCP care in the community Given [facility]VI per family planning program guidelines and  availability  Follow up in: prn prn or as needed. Nexplanon Insertion Procedure Patient identified, informed consent performed, consent signed.   Patient does understand that irregular bleeding is a very common side effect of this medication. She was advised to have backup contraception after placement. Patient was determined to meet WHO criteria for not being pregnant. Appropriate time out taken.  The insertion site was identified 8-10 cm (3-4 inches) from the medial epicondyle of the humerus and 3-5 cm (1.25-2 inches) posterior to (below) the sulcus (groove) between the biceps and triceps muscles of the patient's left arm and marked.  Patient was prepped with alcohol swab and then injected with 3 ml of 1% lidocaine.  Arm was prepped with chlorhexidene, Nexplanon removed from packaging,  Device confirmed in needle, then inserted full length of needle and withdrawn per handbook instructions. Nexplanon was able to palpated in the patient's arm; patient palpated the insert herself. There was minimal blood loss.  Patient insertion site covered with guaze and a pressure bandage to reduce any bruising.  The patient tolerated the procedure well and was given post procedure instructions.    Federico Flake, MD, MPH, ABFM  Medical Director  [facility]

## 2020-07-30 NOTE — Progress Notes (Signed)
HGB results reviewed with Provider.  No orders given. Berdie Ogren, RN

## 2022-11-13 ENCOUNTER — Encounter: Payer: Self-pay | Admitting: Emergency Medicine

## 2022-11-13 ENCOUNTER — Other Ambulatory Visit: Payer: Self-pay

## 2022-11-13 ENCOUNTER — Emergency Department
Admission: EM | Admit: 2022-11-13 | Discharge: 2022-11-13 | Disposition: A | Discharge status: Home / Self Care | Payer: Self-pay | Attending: Emergency Medicine | Admitting: Emergency Medicine

## 2022-11-13 DIAGNOSIS — N760 Acute vaginitis: Secondary | ICD-10-CM | POA: Insufficient documentation

## 2022-11-13 DIAGNOSIS — B9689 Other specified bacterial agents as the cause of diseases classified elsewhere: Secondary | ICD-10-CM | POA: Insufficient documentation

## 2022-11-13 LAB — WET PREP, GENITAL
Sperm: NONE SEEN
Trich, Wet Prep: NONE SEEN
WBC, Wet Prep HPF POC: <10 (ref <10)
Yeast Wet Prep HPF POC: NONE SEEN

## 2022-11-13 LAB — URINALYSIS, ROUTINE W REFLEX MICROSCOPIC
Bilirubin Urine: NEGATIVE
Glucose, UA: NEGATIVE mg/dL
Hgb urine dipstick: NEGATIVE
Ketones, ur: NEGATIVE mg/dL
Leukocytes,Ua: NEGATIVE
Nitrite: NEGATIVE
Protein, ur: NEGATIVE mg/dL
Specific Gravity, Urine: 1.013 (ref 1.005–1.030)
pH: 6 (ref 5.0–8.0)

## 2022-11-13 LAB — CHLAMYDIA/NGC RT PCR (ARMC ONLY)
Chlamydia Tr: NOT DETECTED
N gonorrhoeae: NOT DETECTED

## 2022-11-13 LAB — PREGNANCY, URINE: Preg Test, Ur: NEGATIVE

## 2022-11-13 MED ORDER — METRONIDAZOLE 0.75 % VA GEL: 1.0000 | Freq: Every day | VAGINAL | 0 refills | Status: AC

## 2022-11-13 NOTE — ED Triage Notes (Signed)
Pt sts "vaginal itching and now there is a bad fishy odor. I tried OTC meds but they did not help"

## 2022-11-13 NOTE — ED Provider Notes (Signed)
[facility] Provider Note    Event Date/Time   First MD Initiated Contact with Patient 11/13/22 1229     (approximate)   History   Vaginal Itching   HPI  Lindsey Whitney is a 42 y.o. female who presents today for evaluation of vaginal itching.  Patient reports this has been ongoing for 2 weeks.  She reports that she took Azo with improvement of her symptoms, but now she is noticing a fishy smell.  She reports that she is sexually active with 1 female partner, her husband.  She does not think that she is concerned about STDs.  She denies abdominal pain or burning with urination.  No vaginal bleeding.  No fevers or chills.    Patient Active Problem List   Diagnosis Date Noted   Uterine contractions during pregnancy 04/25/2020   Anemia in pregnancy 02/04/2020   UTI (urinary tract infection) during pregnancy GBS 10/22/19 10/27/2019   Supervision of high risk pregnancy in first trimester 10/22/2019   Supervision of elderly multigravida in first trimester 42 yo 10/22/2019   History of vaginal birth after cesarean--desires TOLAC 10/22/2019   Obesity affecting pregnancy BMI=31.6 10/22/2019          Physical Exam   Triage Vital Signs: ED Triage Vitals  Encounter Vitals Group     BP 11/13/22 1218 (!) 135/97     Systolic BP Percentile --      Diastolic BP Percentile --      Pulse Rate 11/13/22 1218 60     Resp 11/13/22 1218 17     Temp 11/13/22 1218 98.1 F (36.7 C)     Temp Source 11/13/22 1218 Oral     SpO2 11/13/22 1218 98 %     Weight 11/13/22 1219 130 lb (59 kg)     Height 11/13/22 1219 5\' 5"  (1.651 m)     Head Circumference --      Peak Flow --      Pain Score 11/13/22 1219 0     Pain Loc --      Pain Education --      Exclude from Growth Chart --     Most recent vital signs: Vitals:   11/13/22 1218 11/13/22 1539  BP: (!) 135/97 130/88  Pulse: 60 68  Resp: 17 16  Temp: 98.1 F (36.7 C)   SpO2: 98% 98%    Physical Exam Vitals and  nursing note reviewed.  Constitutional:      General: Awake and alert. No acute distress.    Appearance: Normal appearance. The patient is normal weight.  HENT:     Head: Normocephalic and atraumatic.     Mouth: Mucous membranes are moist.  Eyes:     General: PERRL. Normal EOMs        Right eye: No discharge.        Left eye: No discharge.     Conjunctiva/sclera: Conjunctivae normal.  Cardiovascular:     Rate and Rhythm: Normal rate and regular rhythm.     Pulses: Normal pulses.  Pulmonary:     Effort: Pulmonary effort is normal. No respiratory distress.     Breath sounds: Normal breath sounds.  Abdominal:     Abdomen is soft. There is no abdominal tenderness. No rebound or guarding. No distention. Musculoskeletal:        General: No swelling. Normal range of motion.     Cervical back: Normal range of motion and neck supple.  Skin:  General: Skin is warm and dry.     Capillary Refill: Capillary refill takes less than 2 seconds.     Findings: No rash.  Neurological:     Mental Status: The patient is awake and alert.      ED Results / Procedures / Treatments   Labs (all labs ordered are listed, but only abnormal results are displayed) Labs Reviewed  WET PREP, GENITAL - Abnormal; Notable for the following components:      Result Value   Clue Cells Wet Prep HPF POC PRESENT (*)    All other components within normal limits  URINALYSIS, ROUTINE W REFLEX MICROSCOPIC - Abnormal; Notable for the following components:   Color, Urine YELLOW (*)    APPearance CLEAR (*)    All other components within normal limits  CHLAMYDIA/NGC RT PCR ([facility] ONLY)            PREGNANCY, URINE     EKG     RADIOLOGY     PROCEDURES:  Critical Care performed:   Procedures   MEDICATIONS ORDERED IN ED: Medications - No data to display   IMPRESSION / MDM / ASSESSMENT AND PLAN / ED COURSE  I reviewed the triage vital signs and the nursing notes.   Differential diagnosis includes,  but is not limited to, bacterial vaginosis, Candida, STD, UTI.  Patient is awake and alert, hemodynamically stable and afebrile.  She opted to self swab.  She has no abdominal tenderness, do not suspect PID or TOA.  Patient opted to self swab.  Swab is positive for clue cells, suggestive of bacterial vaginosis.  We discussed options for treatment including p.o. Flagyl versus intra vaginal applicators.  Patient opted for the applicators.  We discussed return precautions and outpatient follow-up.  Patient understands and agrees with plan.  She was discharged in stable condition.   Patient's presentation is most consistent with acute complicated illness / injury requiring diagnostic workup.    FINAL CLINICAL IMPRESSION(S) / ED DIAGNOSES   Final diagnoses:  BV (bacterial vaginosis)     Rx / DC Orders   ED Discharge Orders          Ordered    metroNIDAZOLE (METROGEL) 0.75 % vaginal gel  Daily at bedtime        11/13/22 1535             Note:  This document was prepared using Dragon voice recognition software and may include unintentional dictation errors.   Keturah Shavers 11/13/22 1551    Pilar Jarvis, MD 11/13/22 1739

## 2022-11-13 NOTE — Discharge Instructions (Signed)
Please use the intravaginal applicators for the bacterial vaginosis.  This is not a sexually transmitted disease.  Please follow-up with your outpatient provider.  Please return for any new, worsening, or changes in symptoms other concerns.  It was a pleasure caring for you today.

## 2023-10-30 ENCOUNTER — Other Ambulatory Visit (HOSPITAL_COMMUNITY)
Admission: RE | Admit: 2023-10-30 | Discharge: 2023-10-30 | Disposition: A | Discharge status: Home / Self Care | Payer: Self-pay | Source: Ambulatory Visit | Attending: Certified Nurse Midwife | Admitting: Certified Nurse Midwife

## 2023-10-30 ENCOUNTER — Ambulatory Visit (INDEPENDENT_AMBULATORY_CARE_PROVIDER_SITE_OTHER): Payer: Self-pay | Admitting: Certified Nurse Midwife

## 2023-10-30 VITALS — BP 126/84 | HR 63 | Resp 16 | Ht 65.5 in | Wt 189.7 lb

## 2023-10-30 DIAGNOSIS — N898 Other specified noninflammatory disorders of vagina: Secondary | ICD-10-CM

## 2023-10-30 NOTE — Progress Notes (Signed)
    GYNECOLOGY PROGRESS NOTE  Subjective:    Patient ID: Lindsey Whitney, female    DOB: [birth year], 43 y.o.   MRN: [id number]  HPI  Patient is a 44 y.o. [id number] female who presents for vaginal odor. She has been having constant vaginal odor or discharge x 7 months. She describes the discharge as white and the odor as rotten and fishy. Denies any vaginal itching or discomfort.  No known STI contact and in a long term monogamous relationship.   Review of Systems Pertinent items are noted in HPI.   Objective:   Blood pressure 126/84, pulse 63, resp. rate 16, height 5' 5.5 (1.664 m), weight 189 lb 11.2 oz (86 kg), unknown if currently breastfeeding. Body mass index is 31.09 kg/m. General appearance: alert Abdomen: soft, non-tender; bowel sounds normal; no masses,  no organomegaly Pelvic: cervix normal in appearance, external genitalia normal, no adnexal masses or tenderness, positive findings: vaginal discharge:  scant, white, and thin, rectovaginal septum normal, and uterus normal size, shape, and consistency    Assessment:   1. Vaginal odor   2. Vaginal discharge      Plan:   1. Vaginal odor (Primary)/Discharge -Vaginitis swab collected -Plan for 2 weeks of boric acid if swab is negative.

## 2023-11-01 LAB — CERVICOVAGINAL ANCILLARY ONLY
Bacterial Vaginitis (gardnerella): POSITIVE — AB
Candida Glabrata: NEGATIVE
Candida Vaginitis: NEGATIVE
Chlamydia: NEGATIVE
Comment: NEGATIVE
Comment: NEGATIVE
Comment: NEGATIVE
Comment: NEGATIVE
Comment: NEGATIVE
Comment: NORMAL
Neisseria Gonorrhea: NEGATIVE
Trichomonas: NEGATIVE

## 2023-11-02 ENCOUNTER — Ambulatory Visit: Payer: Self-pay | Admitting: Certified Nurse Midwife

## 2023-11-07 ENCOUNTER — Other Ambulatory Visit: Payer: Self-pay

## 2023-11-07 DIAGNOSIS — B9689 Other specified bacterial agents as the cause of diseases classified elsewhere: Secondary | ICD-10-CM

## 2023-11-07 MED ORDER — METRONIDAZOLE 500 MG PO TABS: 500.0000 mg | ORAL_TABLET | Freq: Two times a day (BID) | ORAL | 0 refills | Status: AC
